# Patient Record
Sex: Male | Born: 1945 | Race: White | Hispanic: No | Marital: Married | Smoking: Never smoker
Health system: Southern US, Community
[De-identification: ages and names within clinical notes are randomized; demographics above are authoritative.]

## PROBLEM LIST (undated history)

## (undated) DIAGNOSIS — M1990 Unspecified osteoarthritis, unspecified site: Secondary | ICD-10-CM

## (undated) DIAGNOSIS — I10 Essential (primary) hypertension: Secondary | ICD-10-CM

## (undated) DIAGNOSIS — R319 Hematuria, unspecified: Secondary | ICD-10-CM

## (undated) DIAGNOSIS — R350 Frequency of micturition: Secondary | ICD-10-CM

## (undated) DIAGNOSIS — I499 Cardiac arrhythmia, unspecified: Secondary | ICD-10-CM

## (undated) DIAGNOSIS — Z973 Presence of spectacles and contact lenses: Secondary | ICD-10-CM

## (undated) DIAGNOSIS — N200 Calculus of kidney: Secondary | ICD-10-CM

## (undated) DIAGNOSIS — Z87442 Personal history of urinary calculi: Secondary | ICD-10-CM

## (undated) DIAGNOSIS — N201 Calculus of ureter: Secondary | ICD-10-CM

## (undated) DIAGNOSIS — I4891 Unspecified atrial fibrillation: Secondary | ICD-10-CM

## (undated) DIAGNOSIS — R3915 Urgency of urination: Secondary | ICD-10-CM

## (undated) DIAGNOSIS — Z8739 Personal history of other diseases of the musculoskeletal system and connective tissue: Secondary | ICD-10-CM

## (undated) DIAGNOSIS — C61 Malignant neoplasm of prostate: Secondary | ICD-10-CM

## (undated) HISTORY — PX: CHOLECYSTECTOMY: SHX55

## (undated) HISTORY — PX: CYST EXCISION: SHX5701

## (undated) HISTORY — PX: KNEE ARTHROSCOPY W/ MENISCECTOMY: SHX1879

## (undated) HISTORY — PX: ACHILLES TENDON SURGERY: SHX542

---

## 1979-09-12 DIAGNOSIS — J189 Pneumonia, unspecified organism: Secondary | ICD-10-CM

## 1979-09-12 HISTORY — DX: Pneumonia, unspecified organism: J18.9

## 2001-09-11 HISTORY — PX: LAPAROSCOPIC CHOLECYSTECTOMY: SUR755

## 2006-03-19 IMAGING — CT CT UROGRAM
2 series · 16 of 42 positions shown, 19 images · non-contrast
Comparison: NONE

CLINICAL DATA: Left back, side pain. 

CT UROGRAM
TECHNIQUE: Thin section unenhanced axial images were 
obtained to provide a CT urogram to evaluate for possible urinary 
tract stone.  Scan thicknesses were 3 mm with 3 mm increments.

[Series 2: wo · axial · 0.83mm/px · z∈[+1153,+1522]mm · 13 of 137 slices shown, 16 images]
[im 9/137  soft-tissue]
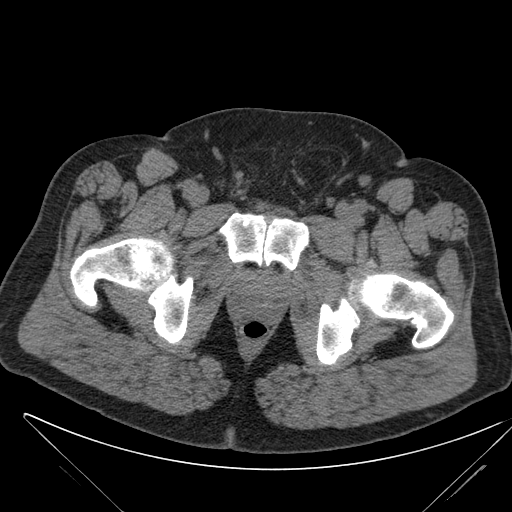
[im 9/137  bone]
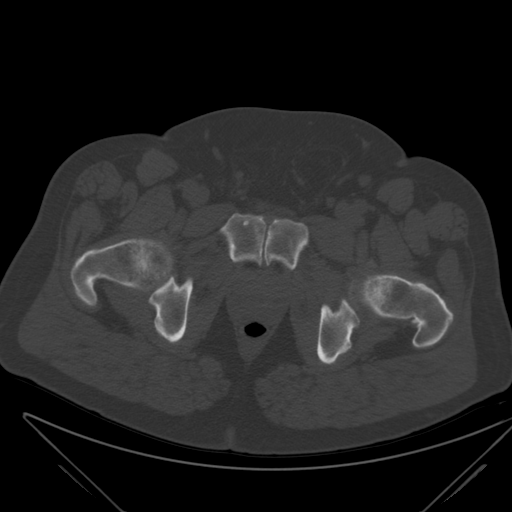
[im 22/137  soft-tissue]
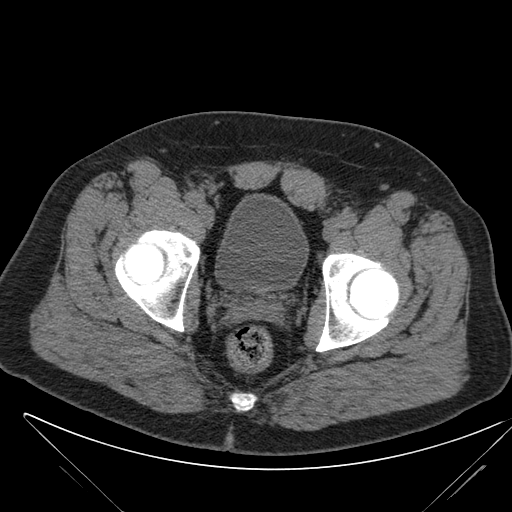
[im 36/137  soft-tissue]
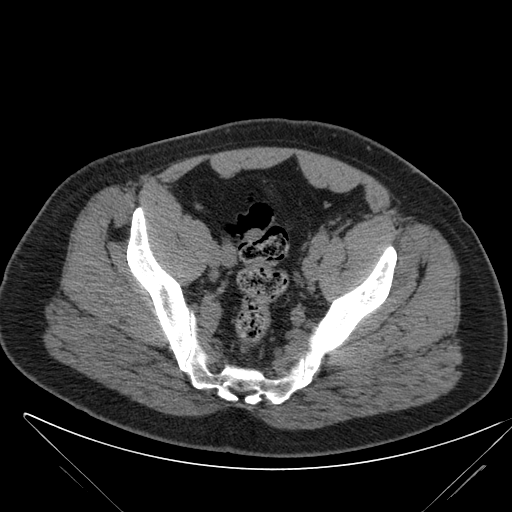
[im 49/137  soft-tissue]
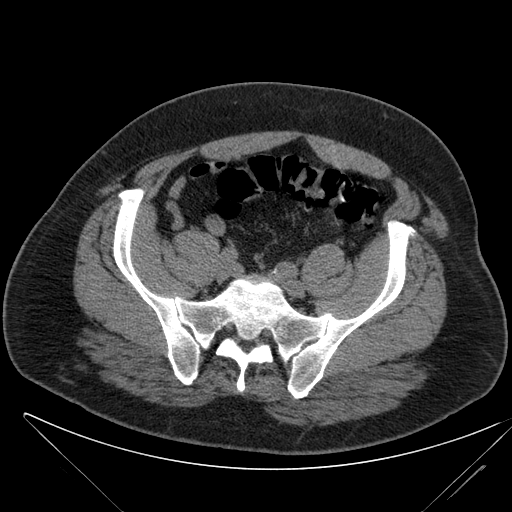
[im 62/137  soft-tissue]
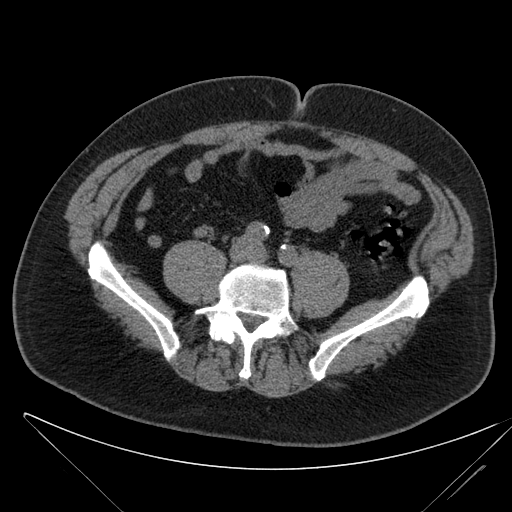
[im 75/137  soft-tissue]
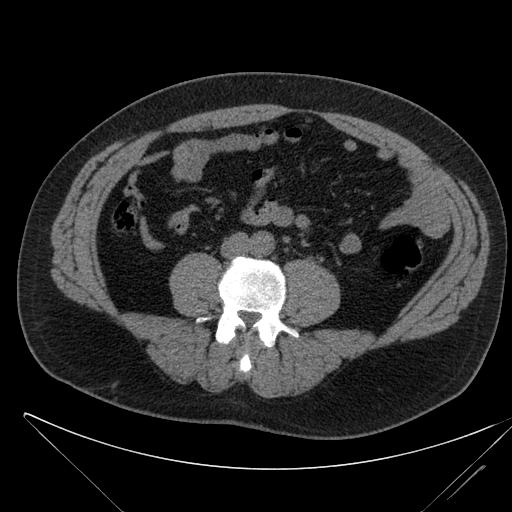
[im 88/137  soft-tissue]
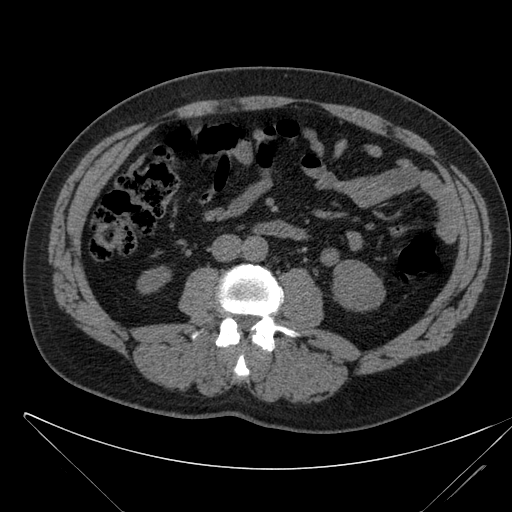
[im 101/137  soft-tissue]
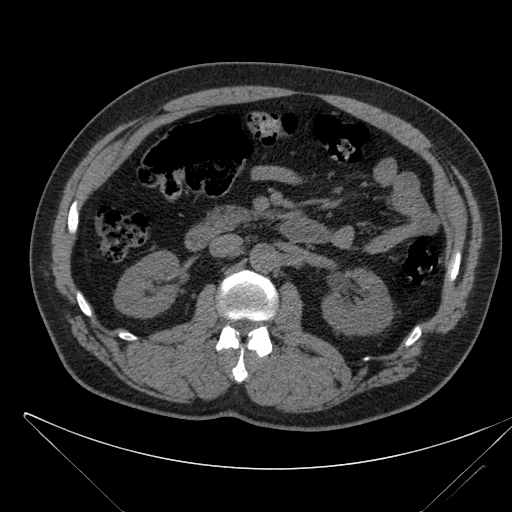
[im 115/137  soft-tissue]
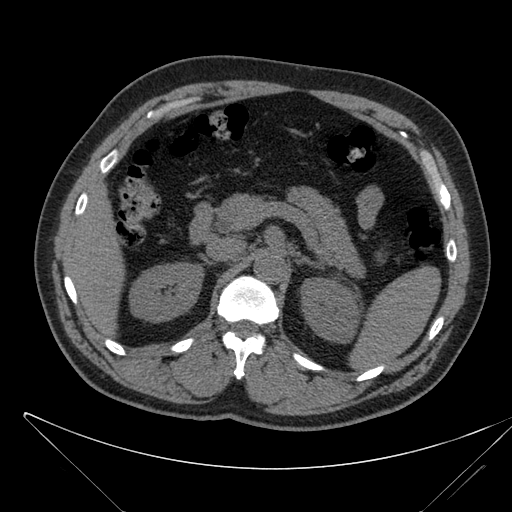
[im 115/137  bone]
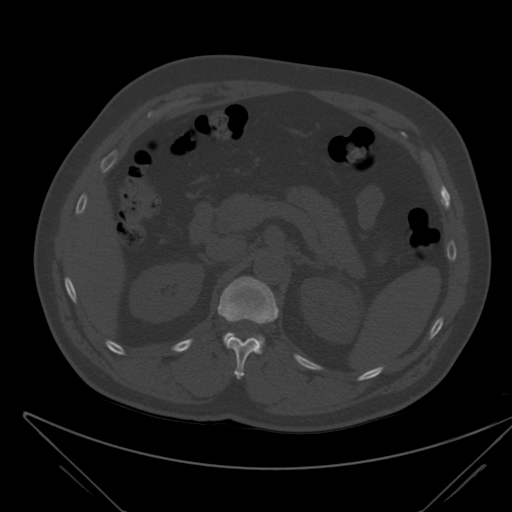
[im 119/137  lung]
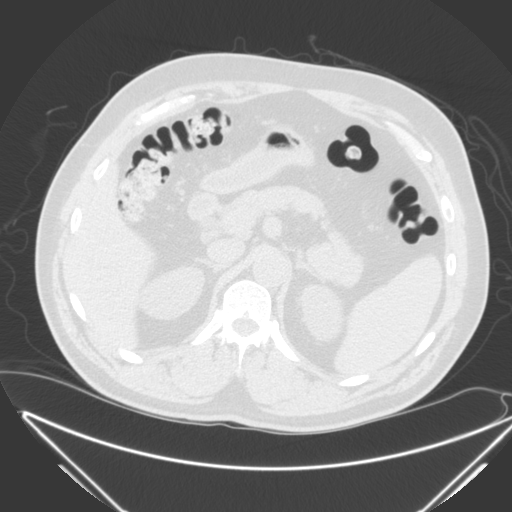
[im 123/137  lung]
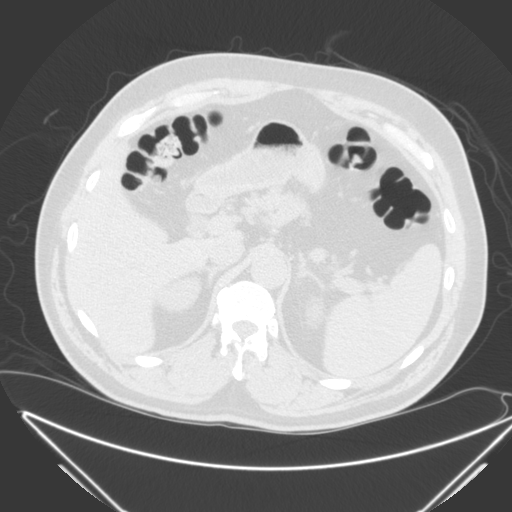
[im 128/137  soft-tissue]
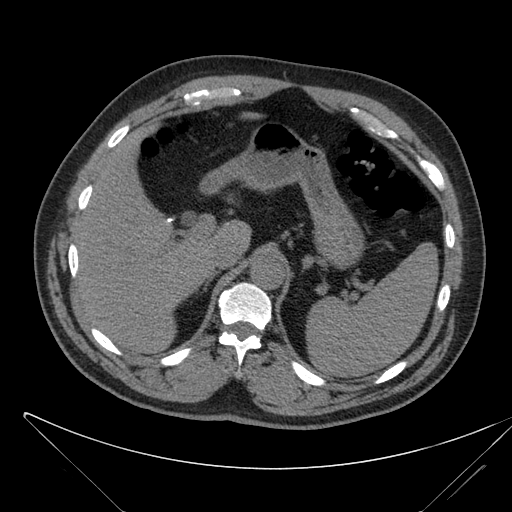
[im 128/137  lung]
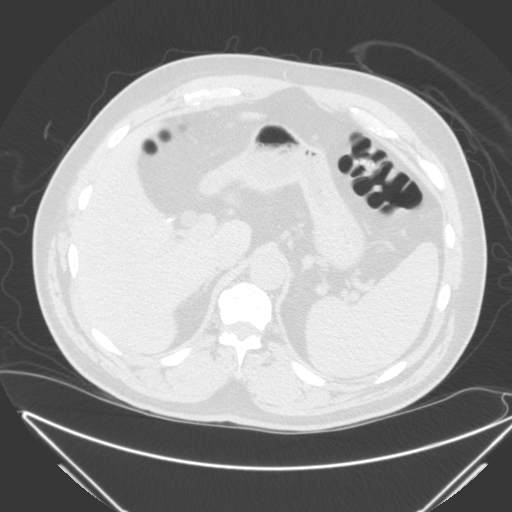
[im 132/137  lung]
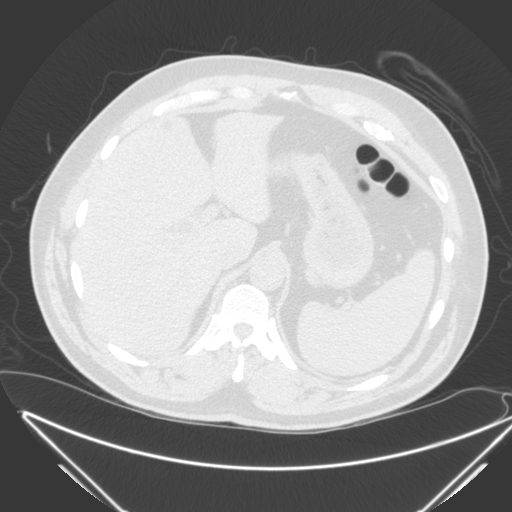

[coronals · coronal · 0.83mm/px · 3 of 81 slices shown]
[im 27/81  soft-tissue]
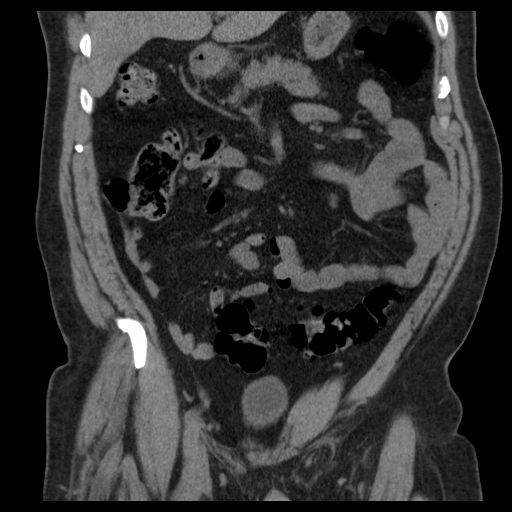
[im 36/81  soft-tissue]
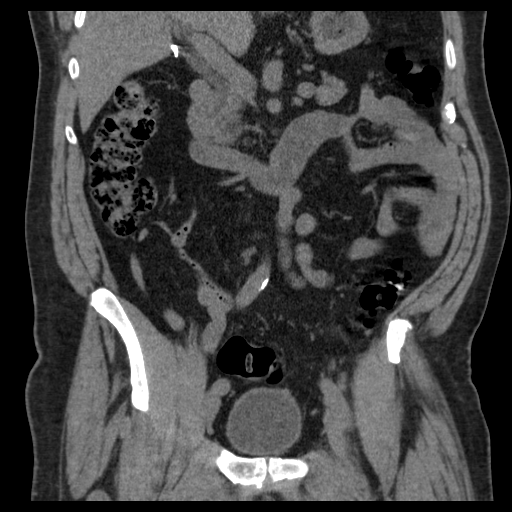
[im 45/81  soft-tissue]
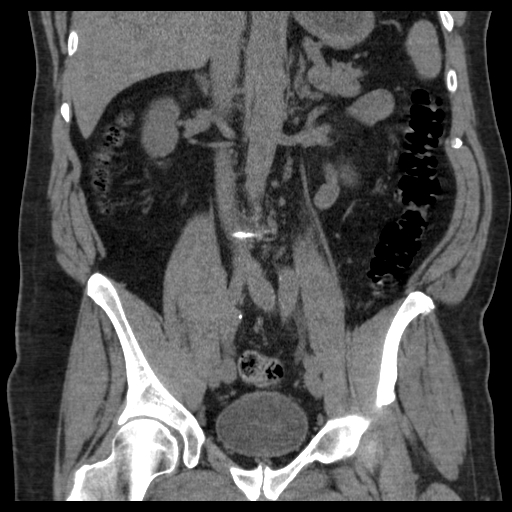

[16 of 42 positions shown; findings below may reference images not displayed]

FINDINGS: The study demonstrates that there is a small 2 to 3 mm 
partially obstructing stone seen at the left UV junction.  There 
is mild ureterectasis on the left as a result.  No 
pelvocaliectasis is seen on the left. No obstructive uropathy is 
seen on the right. There are two nonobstructing intracaliceal 
stones on the right, which measure 2 to 3 mm in size in the upper 
pole and lower pole distribution.  There is one nonobstructing 
intracaliceal stone on the left in the upper pole calix 
distribution. The liver, spleen, adrenal glands, and pancreas are 
normal in appearance.  Cholecystectomy is evident.  Aorta is 
nondilated.  No focal bowel wall thickening is seen. Bladder 
demonstrates mild wall thickening.  The rectum is normal in 
appearance.  Prostate is mildly enlarged.
IMPRESSION: Partially obstructing 2 to 3 mm stone seen at the 
left UV junction with mild left ureterectasis. Three 
nonobstructing intracaliceal stones bilaterally are present as 
described above. Cholecystectomy. Hasanaj Mekolli, M.D.

## 2012-04-08 ENCOUNTER — Ambulatory Visit
Admission: RE | Admit: 2012-04-08 | Discharge: 2012-04-08 | Disposition: A | Discharge status: Home / Self Care | Payer: BC Managed Care – PPO | Source: Ambulatory Visit | Attending: Chiropractic Medicine | Admitting: Chiropractic Medicine

## 2012-04-08 ENCOUNTER — Other Ambulatory Visit: Payer: Self-pay | Admitting: Chiropractic Medicine

## 2012-04-08 DIAGNOSIS — M542 Cervicalgia: Secondary | ICD-10-CM

## 2012-09-11 HISTORY — PX: TOTAL KNEE ARTHROPLASTY: SHX125

## 2012-09-20 ENCOUNTER — Other Ambulatory Visit: Payer: Self-pay | Admitting: Pain Medicine

## 2012-09-27 ENCOUNTER — Encounter (HOSPITAL_COMMUNITY): Payer: Self-pay | Admitting: Pharmacy Technician

## 2012-09-30 NOTE — Patient Instructions (Addendum)
KUPER RENNELS  09/30/2012                           YOUR PROCEDURE IS SCHEDULED ON:  10/07/12               PLEASE REPORT TO SHORT STAY CENTER AT :  12:00 PM               CALL THIS NUMBER IF ANY PROBLEMS THE DAY OF SURGERY :               [phone]                      REMEMBER:   Do not eat food or drink liquids AFTER MIDNIGHT  May have clear liquids UNTIL 6 HOURS BEFORE SURGERY (8:00 AM)  Clear liquids include soda, tea, black coffee, apple or grape juice, broth.  Take these medicines the morning of surgery with A SIP OF WATER:  Tramadol if needed   Do not wear jewelry, make-up   Do not wear lotions, powders, or perfumes.   Do not shave legs or underarms 12 hrs. before surgery (men may shave face)  Do not bring valuables to the hospital.  Contacts, dentures or bridgework may not be worn into surgery.  Leave suitcase in the car. After surgery it may be brought to your room.  For patients admitted to the hospital more than one night, checkout time is 11:00                          The day of discharge.   Patients discharged the day of surgery will not be allowed to drive home                             If going home same day of surgery, must have someone stay with you first                           24 hrs at home and arrange for some one to drive you home from hospital.    Special Instructions:   Please read over the following fact sheets that you were given:               1. MRSA  INFORMATION                      2. [place] PREPARING FOR SURGERY SHEET               3. INCENTIVE SPIROMETER                                                X_____________________________________________________________________        Failure to follow these instructions may result in cancellation of your surgery

## 2012-10-01 ENCOUNTER — Encounter (HOSPITAL_COMMUNITY): Payer: Self-pay

## 2012-10-01 ENCOUNTER — Encounter (HOSPITAL_COMMUNITY)
Admission: RE | Admit: 2012-10-01 | Discharge: 2012-10-01 | Disposition: A | Discharge status: Home / Self Care | Payer: BC Managed Care – PPO | Source: Ambulatory Visit | Attending: Specialist | Admitting: Specialist

## 2012-10-01 HISTORY — DX: Personal history of other diseases of the musculoskeletal system and connective tissue: Z87.39

## 2012-10-01 HISTORY — DX: Unspecified osteoarthritis, unspecified site: M19.90

## 2012-10-01 LAB — CBC WITH DIFFERENTIAL/PLATELET
Basophils Absolute: 0 10*3/uL (ref 0.0–0.1)
Basophils Relative: 0 % (ref 0–1)
Eosinophils Absolute: 0.2 10*3/uL (ref 0.0–0.7)
Eosinophils Relative: 2 % (ref 0–5)
HCT: 46 % (ref 39.0–52.0)
Hemoglobin: 16.2 g/dL (ref 13.0–17.0)
Lymphocytes Relative: 29 % (ref 12–46)
Lymphs Abs: 1.9 10*3/uL (ref 0.7–4.0)
MCH: 31.5 pg (ref 26.0–34.0)
MCHC: 35.2 g/dL (ref 30.0–36.0)
MCV: 89.3 fL (ref 78.0–100.0)
Monocytes Absolute: 0.6 10*3/uL (ref 0.1–1.0)
Monocytes Relative: 8 % (ref 3–12)
Neutro Abs: 4 10*3/uL (ref 1.7–7.7)
Neutrophils Relative %: 60 % (ref 43–77)
Platelets: 180 10*3/uL (ref 150–400)
RBC: 5.15 MIL/uL (ref 4.22–5.81)
RDW: 12.8 % (ref 11.5–15.5)
WBC: 6.6 10*3/uL (ref 4.0–10.5)

## 2012-10-01 LAB — APTT: aPTT: 30 seconds (ref 24–37)

## 2012-10-01 LAB — URINALYSIS, ROUTINE W REFLEX MICROSCOPIC
Bilirubin Urine: NEGATIVE
Glucose, UA: NEGATIVE mg/dL
Hgb urine dipstick: NEGATIVE
Ketones, ur: NEGATIVE mg/dL
Leukocytes, UA: NEGATIVE
Nitrite: NEGATIVE
Protein, ur: NEGATIVE mg/dL
Specific Gravity, Urine: 1.016 (ref 1.005–1.030)
Urobilinogen, UA: 0.2 mg/dL (ref 0.0–1.0)
pH: 5.5 (ref 5.0–8.0)

## 2012-10-01 LAB — COMPREHENSIVE METABOLIC PANEL
ALT: 28 U/L (ref 0–53)
AST: 21 U/L (ref 0–37)
Albumin: 4.3 g/dL (ref 3.5–5.2)
Alkaline Phosphatase: 55 U/L (ref 39–117)
BUN: 16 mg/dL (ref 6–23)
CO2: 27 mEq/L (ref 19–32)
Calcium: 9.9 mg/dL (ref 8.4–10.5)
Chloride: 103 mEq/L (ref 96–112)
Creatinine, Ser: 1.17 mg/dL (ref 0.50–1.35)
GFR calc Af Amer: 74 mL/min — ABNORMAL LOW (ref >90)
GFR calc non Af Amer: 64 mL/min — ABNORMAL LOW (ref >90)
Glucose, Bld: 103 mg/dL — ABNORMAL HIGH (ref 70–99)
Potassium: 4.1 mEq/L (ref 3.5–5.1)
Sodium: 139 mEq/L (ref 135–145)
Total Bilirubin: 0.6 mg/dL (ref 0.3–1.2)
Total Protein: 7.5 g/dL (ref 6.0–8.3)

## 2012-10-01 LAB — PROTIME-INR
INR: 1 (ref 0.00–1.49)
Prothrombin Time: 13.1 seconds (ref 11.6–15.2)

## 2012-10-01 LAB — SURGICAL PCR SCREEN
MRSA, PCR: NEGATIVE
Staphylococcus aureus: NEGATIVE

## 2012-10-02 ENCOUNTER — Other Ambulatory Visit: Payer: Self-pay | Admitting: Pain Medicine

## 2012-10-02 NOTE — H&P (Signed)
TOTAL KNEE ADMISSION H&P  Patient is being admitted for right total knee arthroplasty.  Subjective:  Chief Complaint:right knee pain.  HPI: [name] [name], 67 y.o. male, has a history of pain and functional disability in the right knee due to arthritis and has failed non-surgical conservative treatments for greater than 12 weeks to includeNSAID's and/or analgesics, corticosteriod injections, viscosupplementation injections, supervised PT with diminished ADL's post treatment and activity modification.  Onset of symptoms was abrupt, starting 1 years ago with rapidlly worsening course since that time. The patient noted prior procedures on the knee to include  arthroscopy on the right knee(s).  Patient currently rates pain in the right knee(s) at 7 out of 10 with activity. Patient has worsening of pain with activity and weight bearing.  Patient has evidence of subchondral cysts, subchondral sclerosis, periarticular osteophytes, joint subluxation and joint space narrowing by imaging studies. This patient has had knee scope,visco inj, cort injections. There is no active infection.  There are no active problems to display for this patient.  Past Medical History  Diagnosis Date  . Arthritis   . History of gout     Past Surgical History  Procedure Date  . Achiles   . Cholecystectomy   . Cyst excision     finger     (Not in a hospital admission) No Known Allergies  History  Substance Use Topics  . Smoking status: Never Smoker   . Smokeless tobacco: Not on file  . Alcohol Use: Yes     Comment: social    No family history on file.   Review of Systems  All other systems reviewed and are negative.    Objective:  Physical Exam cons alert appropriate well develop health male, HENT wnl,lungs clear,heart reg,abd soft nt +bs, up extrem wnl good strength, lower legr right 5-115 ROM with crepitus and pain, calfs soft NT, right full ROM painfree, legs NMVI, brreast, rectal defered Vital signs  in last 24 hours: @VSRANGES @  Labs:   Estimated Body mass index is 33.60 kg/(m^2) as calculated from the following:   Height as of 10/01/12: 6\' 4" (1.93 m).   Weight as of 10/01/12: 276 lb(125.193 kg).   Imaging Review Plain radiographs demonstrate severe degenerative joint disease of the right knee(s). The overall alignment ismild varus. The bone quality appears to be excellent for age and reported activity level.  Assessment/Plan:  End stage arthritis, right knee   The patient history, physical examination, clinical judgment of the provider and imaging studies are consistent with end stage degenerative joint disease of the right knee(s) and total knee arthroplasty is deemed medically necessary. The treatment options including medical management, injection therapy arthroscopy and arthroplasty were discussed at length. The risks and benefits of total knee arthroplasty were presented and reviewed. The risks due to aseptic loosening, infection, stiffness, patella tracking problems, thromboembolic complications and other imponderables were discussed. The patient acknowledged the explanation, agreed to proceed with the plan and consent was signed. Patient is being admitted for inpatient treatment for surgery, pain control, PT, OT, prophylactic antibiotics, VTE prophylaxis, progressive ambulation and ADL's and discharge planning. The patient is planning to be discharged home with home health services

## 2012-10-07 NOTE — Progress Notes (Signed)
Patient notified to be in short stay in the AM at 0800-  NPO after midnight except for meds as directed

## 2012-10-08 ENCOUNTER — Encounter (HOSPITAL_COMMUNITY): Payer: Self-pay | Admitting: *Deleted

## 2012-10-08 ENCOUNTER — Encounter (HOSPITAL_COMMUNITY): Payer: Self-pay | Admitting: Anesthesiology

## 2012-10-08 ENCOUNTER — Encounter (HOSPITAL_COMMUNITY)
Admission: RE | Disposition: A | Discharge status: Home Health | Payer: Self-pay | Source: Ambulatory Visit | Attending: Specialist

## 2012-10-08 ENCOUNTER — Inpatient Hospital Stay (HOSPITAL_COMMUNITY)
Admission: RE | Admit: 2012-10-08 | Discharge: 2012-10-11 | DRG: 209 | Disposition: A | Discharge status: Home Health | Payer: BC Managed Care – PPO | Source: Ambulatory Visit | Attending: Specialist | Admitting: Specialist

## 2012-10-08 ENCOUNTER — Inpatient Hospital Stay (HOSPITAL_COMMUNITY): Payer: BC Managed Care – PPO | Admitting: Anesthesiology

## 2012-10-08 DIAGNOSIS — D62 Acute posthemorrhagic anemia: Secondary | ICD-10-CM | POA: Diagnosis not present

## 2012-10-08 DIAGNOSIS — Z01812 Encounter for preprocedural laboratory examination: Secondary | ICD-10-CM

## 2012-10-08 DIAGNOSIS — E871 Hypo-osmolality and hyponatremia: Secondary | ICD-10-CM | POA: Diagnosis not present

## 2012-10-08 DIAGNOSIS — Z96659 Presence of unspecified artificial knee joint: Secondary | ICD-10-CM

## 2012-10-08 DIAGNOSIS — M1710 Unilateral primary osteoarthritis, unspecified knee: Principal | ICD-10-CM | POA: Diagnosis present

## 2012-10-08 HISTORY — PX: TOTAL KNEE ARTHROPLASTY: SHX125

## 2012-10-08 LAB — TYPE AND SCREEN
ABO/RH(D): A POS
Antibody Screen: NEGATIVE

## 2012-10-08 LAB — ABO/RH: ABO/RH(D): A POS

## 2012-10-08 SURGERY — ARTHROPLASTY, KNEE, TOTAL
Anesthesia: Spinal | Site: Knee | Laterality: Right | Wound class: Clean

## 2012-10-08 MED ORDER — HYDROMORPHONE HCL PF 1 MG/ML IJ SOLN
0.5000 mg | INTRAMUSCULAR | Status: DC | PRN
  Administered 2012-10-08 – 2012-10-09 (×5): 1 mg via INTRAVENOUS
  Filled 2012-10-08 (×5): qty 1

## 2012-10-08 MED ORDER — DOCUSATE SODIUM 100 MG PO CAPS
100.0000 mg | ORAL_CAPSULE | Freq: Two times a day (BID) | ORAL | Status: DC
  Administered 2012-10-08 – 2012-10-11 (×6): 100 mg via ORAL

## 2012-10-08 MED ORDER — METOCLOPRAMIDE HCL 10 MG PO TABS: 5.0000 mg | ORAL_TABLET | Freq: Three times a day (TID) | ORAL | Status: DC | PRN

## 2012-10-08 MED ORDER — FERROUS SULFATE 325 (65 FE) MG PO TABS
325.0000 mg | ORAL_TABLET | Freq: Three times a day (TID) | ORAL | Status: DC
  Administered 2012-10-08 – 2012-10-11 (×8): 325 mg via ORAL
  Filled 2012-10-08 (×11): qty 1

## 2012-10-08 MED ORDER — HYDROMORPHONE HCL PF 1 MG/ML IJ SOLN
0.2500 mg | INTRAMUSCULAR | Status: DC | PRN
  Administered 2012-10-08 (×4): 0.5 mg via INTRAVENOUS

## 2012-10-08 MED ORDER — HYDROMORPHONE HCL PF 1 MG/ML IJ SOLN
0.5000 mg | INTRAMUSCULAR | Status: DC | PRN
  Administered 2012-10-08 (×2): 0.5 mg via INTRAVENOUS

## 2012-10-08 MED ORDER — OXYCODONE HCL 5 MG/5ML PO SOLN
5.0000 mg | Freq: Once | ORAL | Status: DC | PRN
  Filled 2012-10-08: qty 5

## 2012-10-08 MED ORDER — ACETAMINOPHEN 650 MG RE SUPP: 650.0000 mg | Freq: Four times a day (QID) | RECTAL | Status: DC | PRN

## 2012-10-08 MED ORDER — SODIUM CHLORIDE 0.9 % IV SOLN
INTRAVENOUS | Status: DC | PRN
  Administered 2012-10-08: 40 mL

## 2012-10-08 MED ORDER — BUPIVACAINE IN DEXTROSE 0.75-8.25 % IT SOLN
INTRATHECAL | Status: DC | PRN
  Administered 2012-10-08: 2 mL via INTRATHECAL

## 2012-10-08 MED ORDER — PROPOFOL 10 MG/ML IV BOLUS
INTRAVENOUS | Status: DC | PRN
  Administered 2012-10-08: 50 mg via INTRAVENOUS
  Administered 2012-10-08: 20 mg via INTRAVENOUS

## 2012-10-08 MED ORDER — ZOLPIDEM TARTRATE 5 MG PO TABS: 5.0000 mg | ORAL_TABLET | Freq: Every evening | ORAL | Status: DC | PRN

## 2012-10-08 MED ORDER — OXYCODONE HCL 5 MG PO TABS
5.0000 mg | ORAL_TABLET | ORAL | Status: DC | PRN
  Administered 2012-10-08: 10 mg via ORAL
  Administered 2012-10-08: 5 mg via ORAL
  Administered 2012-10-08: 10 mg via ORAL
  Administered 2012-10-08: 5 mg via ORAL
  Administered 2012-10-09 – 2012-10-10 (×8): 10 mg via ORAL
  Filled 2012-10-08: qty 1
  Filled 2012-10-08: qty 2
  Filled 2012-10-08: qty 1
  Filled 2012-10-08 (×9): qty 2

## 2012-10-08 MED ORDER — PHENOL 1.4 % MT LIQD
1.0000 | OROMUCOSAL | Status: DC | PRN
  Filled 2012-10-08: qty 177

## 2012-10-08 MED ORDER — METHOCARBAMOL 100 MG/ML IJ SOLN
500.0000 mg | Freq: Four times a day (QID) | INTRAVENOUS | Status: DC | PRN
  Administered 2012-10-08: 500 mg via INTRAVENOUS
  Filled 2012-10-08: qty 5

## 2012-10-08 MED ORDER — POVIDONE-IODINE 7.5 % EX SOLN
Freq: Once | CUTANEOUS | Status: DC
Start: 2012-10-08 — End: 2012-10-08

## 2012-10-08 MED ORDER — POLYETHYLENE GLYCOL 3350 17 G PO PACK
17.0000 g | PACK | Freq: Every day | ORAL | Status: DC | PRN
  Administered 2012-10-10 – 2012-10-11 (×2): 17 g via ORAL

## 2012-10-08 MED ORDER — HYDROMORPHONE HCL PF 1 MG/ML IJ SOLN
INTRAMUSCULAR | Status: DC | PRN
  Administered 2012-10-08: 1 mg via INTRAVENOUS

## 2012-10-08 MED ORDER — MIDAZOLAM HCL 5 MG/5ML IJ SOLN
INTRAMUSCULAR | Status: DC | PRN
  Administered 2012-10-08: 2 mg via INTRAVENOUS

## 2012-10-08 MED ORDER — FLEET ENEMA 7-19 GM/118ML RE ENEM: 1.0000 | ENEMA | Freq: Once | RECTAL | Status: AC | PRN

## 2012-10-08 MED ORDER — ALUM & MAG HYDROXIDE-SIMETH 200-200-20 MG/5ML PO SUSP
30.0000 mL | ORAL | Status: DC | PRN
  Administered 2012-10-09 (×2): 30 mL via ORAL
  Filled 2012-10-08 (×2): qty 30

## 2012-10-08 MED ORDER — BISACODYL 10 MG RE SUPP: 10.0000 mg | Freq: Every day | RECTAL | Status: DC | PRN

## 2012-10-08 MED ORDER — ACETAMINOPHEN 10 MG/ML IV SOLN: 1000.0000 mg | Freq: Once | INTRAVENOUS | Status: DC | PRN

## 2012-10-08 MED ORDER — DEXTROSE 5 % IV SOLN
3.0000 g | INTRAVENOUS | Status: AC
  Administered 2012-10-08: 3 g via INTRAVENOUS
  Filled 2012-10-08: qty 3000

## 2012-10-08 MED ORDER — ENOXAPARIN SODIUM 30 MG/0.3ML ~~LOC~~ SOLN
30.0000 mg | Freq: Two times a day (BID) | SUBCUTANEOUS | Status: DC
  Administered 2012-10-09 – 2012-10-11 (×5): 30 mg via SUBCUTANEOUS
  Filled 2012-10-08 (×7): qty 0.3

## 2012-10-08 MED ORDER — SODIUM CHLORIDE 0.9 % IV SOLN: INTRAVENOUS | Status: DC

## 2012-10-08 MED ORDER — METOCLOPRAMIDE HCL 5 MG/ML IJ SOLN: 5.0000 mg | Freq: Three times a day (TID) | INTRAMUSCULAR | Status: DC | PRN

## 2012-10-08 MED ORDER — KETAMINE HCL 10 MG/ML IJ SOLN
INTRAMUSCULAR | Status: DC | PRN
  Administered 2012-10-08 (×2): 10 mg via INTRAVENOUS

## 2012-10-08 MED ORDER — ACETAMINOPHEN 10 MG/ML IV SOLN
1000.0000 mg | Freq: Four times a day (QID) | INTRAVENOUS | Status: AC
  Administered 2012-10-08 – 2012-10-09 (×3): 1000 mg via INTRAVENOUS
  Filled 2012-10-08 (×6): qty 100

## 2012-10-08 MED ORDER — PROMETHAZINE HCL 25 MG/ML IJ SOLN: 6.2500 mg | INTRAMUSCULAR | Status: DC | PRN

## 2012-10-08 MED ORDER — METHOCARBAMOL 500 MG PO TABS
500.0000 mg | ORAL_TABLET | Freq: Four times a day (QID) | ORAL | Status: DC | PRN
  Administered 2012-10-08 – 2012-10-11 (×7): 500 mg via ORAL
  Filled 2012-10-08 (×7): qty 1

## 2012-10-08 MED ORDER — PROPOFOL INFUSION 10 MG/ML OPTIME
INTRAVENOUS | Status: DC | PRN
  Administered 2012-10-08: 160 ug/kg/min via INTRAVENOUS

## 2012-10-08 MED ORDER — SODIUM CHLORIDE 0.9 % IR SOLN
Status: DC | PRN
  Administered 2012-10-08: 3000 mL
  Administered 2012-10-08: 1000 mL

## 2012-10-08 MED ORDER — HYDROMORPHONE HCL PF 1 MG/ML IJ SOLN
INTRAMUSCULAR | Status: AC
  Filled 2012-10-08: qty 1

## 2012-10-08 MED ORDER — DIPHENHYDRAMINE HCL 12.5 MG/5ML PO ELIX: 12.5000 mg | ORAL_SOLUTION | ORAL | Status: DC | PRN

## 2012-10-08 MED ORDER — MENTHOL 3 MG MT LOZG
1.0000 | LOZENGE | OROMUCOSAL | Status: DC | PRN
  Filled 2012-10-08: qty 9

## 2012-10-08 MED ORDER — ONDANSETRON HCL 4 MG/2ML IJ SOLN: 4.0000 mg | Freq: Four times a day (QID) | INTRAMUSCULAR | Status: DC | PRN

## 2012-10-08 MED ORDER — BUPIVACAINE LIPOSOME 1.3 % IJ SUSP
20.0000 mL | Freq: Once | INTRAMUSCULAR | Status: AC
  Administered 2012-10-08: 20 mL
  Filled 2012-10-08: qty 20

## 2012-10-08 MED ORDER — ACETAMINOPHEN 10 MG/ML IV SOLN
INTRAVENOUS | Status: DC | PRN
  Administered 2012-10-08: 1000 mg via INTRAVENOUS

## 2012-10-08 MED ORDER — ATROPINE SULFATE 0.4 MG/ML IJ SOLN
0.4000 mg | Freq: Once | INTRAMUSCULAR | Status: AC
  Filled 2012-10-08: qty 1

## 2012-10-08 MED ORDER — OXYCODONE HCL 5 MG PO TABS: 5.0000 mg | ORAL_TABLET | Freq: Once | ORAL | Status: DC | PRN

## 2012-10-08 MED ORDER — ATROPINE SULFATE 1 MG/ML IJ SOLN
INTRAMUSCULAR | Status: AC
  Administered 2012-10-08: 0.4 mg
  Filled 2012-10-08: qty 1

## 2012-10-08 MED ORDER — MEPERIDINE HCL 50 MG/ML IJ SOLN: 6.2500 mg | INTRAMUSCULAR | Status: DC | PRN

## 2012-10-08 MED ORDER — ONDANSETRON HCL 4 MG PO TABS: 4.0000 mg | ORAL_TABLET | Freq: Four times a day (QID) | ORAL | Status: DC | PRN

## 2012-10-08 MED ORDER — CELECOXIB 200 MG PO CAPS
200.0000 mg | ORAL_CAPSULE | Freq: Two times a day (BID) | ORAL | Status: DC
  Administered 2012-10-08 – 2012-10-09 (×3): 200 mg via ORAL
  Filled 2012-10-08 (×5): qty 1

## 2012-10-08 MED ORDER — ACETAMINOPHEN 325 MG PO TABS: 650.0000 mg | ORAL_TABLET | Freq: Four times a day (QID) | ORAL | Status: DC | PRN

## 2012-10-08 MED ORDER — CEFAZOLIN SODIUM-DEXTROSE 2-3 GM-% IV SOLR
2.0000 g | Freq: Four times a day (QID) | INTRAVENOUS | Status: AC
  Administered 2012-10-08 (×2): 2 g via INTRAVENOUS
  Filled 2012-10-08 (×2): qty 50

## 2012-10-08 MED ORDER — LACTATED RINGERS IV SOLN
INTRAVENOUS | Status: DC
  Administered 2012-10-08: 1000 mL via INTRAVENOUS
  Administered 2012-10-08 (×2): via INTRAVENOUS

## 2012-10-08 MED ORDER — POTASSIUM CHLORIDE IN NACL 20-0.9 MEQ/L-% IV SOLN
INTRAVENOUS | Status: DC
  Administered 2012-10-08 – 2012-10-09 (×2): via INTRAVENOUS
  Filled 2012-10-08 (×3): qty 1000

## 2012-10-08 SURGICAL SUPPLY — 67 items
BAG ZIPLOCK 12X15 (MISCELLANEOUS) ×2 IMPLANT
BANDAGE ELASTIC 4 VELCRO ST LF (GAUZE/BANDAGES/DRESSINGS) ×2 IMPLANT
BANDAGE ELASTIC 6 VELCRO ST LF (GAUZE/BANDAGES/DRESSINGS) ×2 IMPLANT
BANDAGE ESMARK 6X9 LF (GAUZE/BANDAGES/DRESSINGS) ×1 IMPLANT
BANDAGE GAUZE ELAST BULKY 4 IN (GAUZE/BANDAGES/DRESSINGS) ×4 IMPLANT
BLADE SAG 18X100X1.27 (BLADE) ×2 IMPLANT
BLADE SAW SGTL 13.0X1.19X90.0M (BLADE) ×2 IMPLANT
BNDG ESMARK 6X9 LF (GAUZE/BANDAGES/DRESSINGS) ×2
CEMENT HV SMART SET (Cement) ×4 IMPLANT
CLOTH BEACON ORANGE TIMEOUT ST (SAFETY) ×2 IMPLANT
CUFF TOURN SGL QUICK 34 (TOURNIQUET CUFF) ×1
CUFF TRNQT CYL 34X4X40X1 (TOURNIQUET CUFF) ×1 IMPLANT
DRAPE EXTREMITY T 121X128X90 (DRAPE) ×2 IMPLANT
DRAPE LG THREE QUARTER DISP (DRAPES) ×2 IMPLANT
DRAPE POUCH INSTRU U-SHP 10X18 (DRAPES) ×2 IMPLANT
DRAPE U-SHAPE 47X51 STRL (DRAPES) ×2 IMPLANT
DRSG PAD ABDOMINAL 8X10 ST (GAUZE/BANDAGES/DRESSINGS) ×4 IMPLANT
DURAPREP 26ML APPLICATOR (WOUND CARE) ×4 IMPLANT
ELECT REM PT RETURN 9FT ADLT (ELECTROSURGICAL) ×2
ELECTRODE REM PT RTRN 9FT ADLT (ELECTROSURGICAL) ×1 IMPLANT
EVACUATOR 1/8 PVC DRAIN (DRAIN) ×2 IMPLANT
FACESHIELD LNG OPTICON STERILE (SAFETY) ×10 IMPLANT
GAUZE XEROFORM 2X2 STRL (GAUZE/BANDAGES/DRESSINGS) ×2 IMPLANT
GLOVE BIO SURGEON STRL SZ 6.5 (GLOVE) ×2 IMPLANT
GLOVE BIOGEL PI IND STRL 6.5 (GLOVE) ×1 IMPLANT
GLOVE BIOGEL PI IND STRL 8 (GLOVE) ×1 IMPLANT
GLOVE BIOGEL PI INDICATOR 6.5 (GLOVE) ×1
GLOVE BIOGEL PI INDICATOR 8 (GLOVE) ×1
GLOVE ECLIPSE 8.0 STRL XLNG CF (GLOVE) ×2 IMPLANT
GLOVE SURG ORTHO 8.0 STRL STRW (GLOVE) ×4 IMPLANT
GLOVE SURG ORTHO 9.0 STRL STRW (GLOVE) ×2 IMPLANT
GLOVE SURG SS PI 6.5 STRL IVOR (GLOVE) ×6 IMPLANT
GOWN PREVENTION PLUS XLARGE (GOWN DISPOSABLE) ×6 IMPLANT
GOWN STRL NON-REIN LRG LVL3 (GOWN DISPOSABLE) ×2 IMPLANT
GOWN STRL REIN XL XLG (GOWN DISPOSABLE) ×2 IMPLANT
HANDPIECE INTERPULSE COAX TIP (DISPOSABLE) ×1
IMMOBILIZER KNEE 20 (SOFTGOODS) ×4
IMMOBILIZER KNEE 20 THIGH 36 (SOFTGOODS) ×2 IMPLANT
KIT BASIN OR (CUSTOM PROCEDURE TRAY) ×2 IMPLANT
NDL SAFETY ECLIPSE 18X1.5 (NEEDLE) ×1 IMPLANT
NEEDLE HYPO 18GX1.5 SHARP (NEEDLE) ×1
NS IRRIG 1000ML POUR BTL (IV SOLUTION) ×2 IMPLANT
PACK TOTAL JOINT (CUSTOM PROCEDURE TRAY) ×2 IMPLANT
POSITIONER SURGICAL ARM (MISCELLANEOUS) ×2 IMPLANT
SET HNDPC FAN SPRY TIP SCT (DISPOSABLE) ×1 IMPLANT
SET PAD KNEE POSITIONER (MISCELLANEOUS) ×2 IMPLANT
SPONGE GAUZE 4X4 12PLY (GAUZE/BANDAGES/DRESSINGS) ×2 IMPLANT
SPONGE LAP 18X18 X RAY DECT (DISPOSABLE) IMPLANT
SPONGE SURGIFOAM ABS GEL 100 (HEMOSTASIS) ×2 IMPLANT
STOCKINETTE 6  STRL (DRAPES) ×1
STOCKINETTE 6 STRL (DRAPES) ×1 IMPLANT
STRIP CLOSURE SKIN 1/2X4 (GAUZE/BANDAGES/DRESSINGS) ×2 IMPLANT
SUCTION FRAZIER 12FR DISP (SUCTIONS) ×2 IMPLANT
SUT BONE WAX W31G (SUTURE) ×2 IMPLANT
SUT MNCRL AB 3-0 PS2 18 (SUTURE) ×2 IMPLANT
SUT VIC AB 1 CT1 27 (SUTURE) ×3
SUT VIC AB 1 CT1 27XBRD ANTBC (SUTURE) ×3 IMPLANT
SUT VIC AB 2-0 CT1 27 (SUTURE) ×2
SUT VIC AB 2-0 CT1 TAPERPNT 27 (SUTURE) ×2 IMPLANT
SUT VLOC 180 0 24IN GS25 (SUTURE) ×2 IMPLANT
SYR 50ML LL SCALE MARK (SYRINGE) ×2 IMPLANT
TAPE STRIPS DRAPE STRL (GAUZE/BANDAGES/DRESSINGS) ×2 IMPLANT
TOWEL OR 17X26 10 PK STRL BLUE (TOWEL DISPOSABLE) ×4 IMPLANT
TOWER CARTRIDGE SMART MIX (DISPOSABLE) ×2 IMPLANT
TRAY FOLEY CATH 14FRSI W/METER (CATHETERS) ×2 IMPLANT
WATER STERILE IRR 1500ML POUR (IV SOLUTION) ×4 IMPLANT
WRAP KNEE MAXI GEL POST OP (GAUZE/BANDAGES/DRESSINGS) ×2 IMPLANT

## 2012-10-08 NOTE — H&P (Signed)
TOTAL KNEE ADMISSION H&P  Patient is being admitted for right total knee arthroplasty.  Subjective:  Chief Complaint:right knee pain.  HPI: [name] [name], 67 y.o. male, has a history of pain and functional disability in the right knee due to arthritis and has failed non-surgical conservative treatments for greater than 12 weeks to includeNSAID's and/or analgesics, corticosteriod injections, viscosupplementation injections, supervised PT with diminished ADL's post treatment and activity modification.  Onset of symptoms was abrupt, starting 1 years ago with rapidlly worsening course since that time. The patient noted prior procedures on the knee to include  arthroscopy on the right knee(s).  Patient currently rates pain in the right knee(s) at 7 out of 10 with activity. Patient has worsening of pain with activity and weight bearing.  Patient has evidence of subchondral cysts, subchondral sclerosis, periarticular osteophytes, joint subluxation and joint space narrowing by imaging studies. This patient has had knee scope,visco inj, cort injections. There is no active infection.  There are no active problems to display for this patient.  Past Medical History  Diagnosis Date  . Arthritis   . History of gout     Past Surgical History  Procedure Date  . Achiles   . Cholecystectomy   . Cyst excision     finger    Prescriptions prior to admission  Medication Sig Dispense Refill  . acetaminophen (TYLENOL) 500 MG tablet Take 1,500 mg by mouth every 6 (six) hours as needed. PAIN      . meloxicam (MOBIC) 15 MG tablet Take 15 mg by mouth daily.      . traMADol (ULTRAM) 50 MG tablet Take 50 mg by mouth every 6 (six) hours as needed. Pain       No Known Allergies  History  Substance Use Topics  . Smoking status: Never Smoker   . Smokeless tobacco: Not on file  . Alcohol Use: Yes     Comment: social    History reviewed. No pertinent family history.   Review of Systems  All other systems  reviewed and are negative.    Objective:  Physical Exam cons alert appropriate well develop health male, HENT wnl,lungs clear,heart reg,abd soft nt +bs, up extrem wnl good strength, lower legr right 5-115 ROM with crepitus and pain, calfs soft NT, right full ROM painfree, legs NMVI, brreast, rectal defered Vital signs in last 24 hours: @VSRANGES @  Labs:   There is no height or weight on file to calculate BMI.   Imaging Review Plain radiographs demonstrate severe degenerative joint disease of the right knee(s). The overall alignment ismild varus. The bone quality appears to be excellent for age and reported activity level.  Assessment/Plan:  End stage arthritis, right knee   The patient history, physical examination, clinical judgment of the provider and imaging studies are consistent with end stage degenerative joint disease of the right knee(s) and total knee arthroplasty is deemed medically necessary. The treatment options including medical management, injection therapy arthroscopy and arthroplasty were discussed at length. The risks and benefits of total knee arthroplasty were presented and reviewed. The risks due to aseptic loosening, infection, stiffness, patella tracking problems, thromboembolic complications and other imponderables were discussed. The patient acknowledged the explanation, agreed to proceed with the plan and consent was signed. Patient is being admitted for inpatient treatment for surgery, pain control, PT, OT, prophylactic antibiotics, VTE prophylaxis, progressive ambulation and ADL's and discharge planning. The patient is planning to be discharged home with home health services

## 2012-10-08 NOTE — Progress Notes (Signed)
Heart rate now up to 70

## 2012-10-08 NOTE — Plan of Care (Signed)
Problem: Consults Goal: Diagnosis- Total Joint Replacement Primary Total Knee     

## 2012-10-08 NOTE — Anesthesia Procedure Notes (Signed)
Spinal  Patient location during procedure: OR Start time: 10/08/2012 11:15 AM End time: 10/08/2012 11:20 AM Staffing Anesthesiologist: Lewie Loron R Performed by: anesthesiologist  Preanesthetic Checklist Completed: patient identified, site marked, surgical consent, pre-op evaluation, timeout performed, IV checked, risks and benefits discussed and monitors and equipment checked Spinal Block Patient position: sitting Prep: ChloraPrep Patient monitoring: heart rate, continuous pulse ox and blood pressure Approach: midline Location: L3-4 Injection technique: single-shot Needle Needle type: Sprotte  Needle gauge: 24 G Needle length: 9 cm Assessment Sensory level: T8

## 2012-10-08 NOTE — Anesthesia Postprocedure Evaluation (Signed)
  Anesthesia Post-op Note  Patient: [name] [name]  Procedure(s) Performed: Procedure(s) (LRB): TOTAL KNEE ARTHROPLASTY (Right)  Patient Location: PACU  Anesthesia Type: Spinal  Level of Consciousness: awake and alert   Airway and Oxygen Therapy: Patient Spontanous Breathing  Post-op Pain: mild  Post-op Assessment: Post-op Vital signs reviewed, Patient's Cardiovascular Status Stable, Respiratory Function Stable, Patent Airway and No signs of Nausea or vomiting  Last Vitals:  Filed Vitals:   10/08/12 1530  BP: 148/74  Pulse: 59  Temp: 36.8 C  Resp: 16    Post-op Vital Signs: stable   Complications: No apparent anesthesia complications. Moving both legs.

## 2012-10-08 NOTE — Anesthesia Preprocedure Evaluation (Addendum)
Anesthesia Evaluation  Patient identified by MRN, date of birth, ID band Patient awake    Reviewed: Allergy & Precautions, H&P , NPO status , Patient's Chart, lab work & pertinent test results  Airway Mallampati: I TM Distance: >3 FB Neck ROM: Full    Dental  (+) Dental Advisory Given and Teeth Intact   Pulmonary neg pulmonary ROS,  breath sounds clear to auscultation  Pulmonary exam normal       Cardiovascular negative cardio ROS  Rhythm:Regular Rate:Normal     Neuro/Psych negative neurological ROS  negative psych ROS   GI/Hepatic negative GI ROS, Neg liver ROS,   Endo/Other  negative endocrine ROS  Renal/GU negative Renal ROS  negative genitourinary   Musculoskeletal  (+) Arthritis -, Osteoarthritis,    Abdominal   Peds negative pediatric ROS (+)  Hematology negative hematology ROS (+)   Anesthesia Other Findings   Reproductive/Obstetrics                         Anesthesia Physical Anesthesia Plan  ASA: II  Anesthesia Plan: Spinal   Post-op Pain Management:    Induction:   Airway Management Planned: Simple Face Mask  Additional Equipment:   Intra-op Plan:   Post-operative Plan:   Informed Consent: I have reviewed the patients History and Physical, chart, labs and discussed the procedure including the risks, benefits and alternatives for the proposed anesthesia with the patient or authorized representative who has indicated his/her understanding and acceptance.   Dental advisory given  Plan Discussed with: CRNA  Anesthesia Plan Comments:        Anesthesia Quick Evaluation

## 2012-10-08 NOTE — Op Note (Signed)
DATE OF SURGERY:  10/08/2012  TIME: 1:21 PM  PATIENT NAME:  [name] [name]    AGE: 67 y.o.   PRE-OPERATIVE DIAGNOSIS:  right knee oa   POST-OPERATIVE DIAGNOSIS:  right knee oa   PROCEDURE:  Procedure(s): TOTAL KNEE ARTHROPLASTY  SURGEON:  Marshae Azam ANDREW  ASSISTANT:  Oneida Alar, PA-C, present and scrubbed throughout the case, critical for assistance with exposure, retraction, instrumentation, and closure.  OPERATIVE IMPLANTS: Depuy PFC Sigma Rotating Platform.  Femur size 5, Tibia size 6, Patella size 41 3-peg oval button, with a 12.5 mm polyethylene insert.   PREOPERATIVE INDICATIONS:   [name] [name] is a 67 y.o. year old male with end stage bone on bone arthritis of the knee who failed conservative treatment and elected for Total Knee Arthroplasty.   The risks, benefits, and alternatives were discussed at length including but not limited to the risks of infection, bleeding, nerve injury, stiffness, blood clots, the need for revision surgery, cardiopulmonary complications, among others, and they were willing to proceed.  OPERATIVE DESCRIPTION:  The patient was brought to the operative room and placed in a supine position.  Spinal anesthesia was administered.  IV antibiotics were given.  The lower extremity was prepped and draped in the usual sterile fashion.  Time out was performed.  The leg was elevated and exsanguinated and the tourniquet was inflated.  Anterior quadriceps tendon splitting approach was performed.  The patella was retracted and osteophytes were removed.  The anterior horn of the medial and lateral meniscus was removed and cruciate ligaments resected.   The distal femur was opened with the drill and the intramedullary distal femoral cutting jig was utilized, set at 5 degrees resecting 10 mm off the distal femur.  Care was taken to protect the collateral ligaments.  The distal femoral sizing jig was applied, taking care to avoid notching.  Then the 4-in-1  cutting jig was applied and the anterior and posterior femur was cut, along with the chamfer cuts.    Then the extramedullary tibial cutting jig was utilized making the appropriate cut using the anterior tibial crest as a reference building in appropriate posterior slope.  Care was taken during the cut to protect the medial and collateral ligaments.  The proximal tibia was removed along with the posterior horns of the menisci.   The posterior medial femoral osteophytes and posterior lateral femoral osteophytes were removed.    The flexion gap was then measured and was symmetric with the extension gap, measured at 10.  I completed the distal femoral preparation using the appropriate jig to prepare the box.  The patella was then measured, and cut with the saw.    The proximal tibia sized and prepared accordingly with the reamer and the punch, and then all components were trialed with the trial insert.  The knee was found to have excellent balance and full motion.    The above named components were then cemented into place and all excess cement was removed.  The trial polyethylene component was in place during cementation, and then was exchanged for the real polyethylene component.    The knee was easily taken through a range of motion and the patella tracked well and the knee irrigated copiously and the parapatellar and subcutaneous tissue closed with vicryl, and monocryl with steri strips for the skin.  The arthrotomy was closed at 90 of flexion. The wounds were dressed with sterile gauze and the tourniquet released and the patient was awakened and returned to the  PACU in stable and satisfactory condition.  There were no complications.  Total tourniquet time was 90 minutes.Vlock capsule closure. perosteal injection 20cc Esperaldo and 40 cc NS.

## 2012-10-08 NOTE — Transfer of Care (Signed)
Immediate Anesthesia Transfer of Care Note  Patient: [name] [name]  Procedure(s) Performed: Procedure(s) (LRB) with comments: TOTAL KNEE ARTHROPLASTY (Right)  Patient Location: PACU  Anesthesia Type:MAC and Spinal  Level of Consciousness: awake, alert , oriented, patient cooperative and responds to stimulation  Airway & Oxygen Therapy: Patient Spontanous Breathing and Patient connected to face mask oxygen  Post-op Assessment: Report given to PACU RN and Post -op Vital signs reviewed and stable  Post vital signs: Reviewed and stable  Complications: No apparent anesthesia complications

## 2012-10-08 NOTE — Preoperative (Signed)
Beta Blockers   Reason not to administer Beta Blockers:Not Applicable, not on home BB 

## 2012-10-08 NOTE — Progress Notes (Signed)
Atropine 0.4 mg. IVP given as ordered by Dr. Council Mechanic for slow heart rate

## 2012-10-09 ENCOUNTER — Encounter (HOSPITAL_COMMUNITY): Payer: Self-pay | Admitting: Specialist

## 2012-10-09 LAB — BASIC METABOLIC PANEL
BUN: 13 mg/dL (ref 6–23)
CO2: 25 mEq/L (ref 19–32)
Calcium: 8.2 mg/dL — ABNORMAL LOW (ref 8.4–10.5)
Chloride: 99 mEq/L (ref 96–112)
Creatinine, Ser: 0.89 mg/dL (ref 0.50–1.35)
GFR calc Af Amer: >90 mL/min (ref >90)
GFR calc non Af Amer: 88 mL/min — ABNORMAL LOW (ref >90)
Glucose, Bld: 142 mg/dL — ABNORMAL HIGH (ref 70–99)
Potassium: 4 mEq/L (ref 3.5–5.1)
Sodium: 134 mEq/L — ABNORMAL LOW (ref 135–145)

## 2012-10-09 LAB — CBC
HCT: 37.6 % — ABNORMAL LOW (ref 39.0–52.0)
Hemoglobin: 13 g/dL (ref 13.0–17.0)
MCH: 30.8 pg (ref 26.0–34.0)
MCHC: 34.6 g/dL (ref 30.0–36.0)
MCV: 89.1 fL (ref 78.0–100.0)
Platelets: 175 10*3/uL (ref 150–400)
RBC: 4.22 MIL/uL (ref 4.22–5.81)
RDW: 13.2 % (ref 11.5–15.5)
WBC: 11.9 10*3/uL — ABNORMAL HIGH (ref 4.0–10.5)

## 2012-10-09 NOTE — Evaluation (Addendum)
Physical Therapy Evaluation Patient Details Name: [name] [name] MRN: [id number] DOB: [birth year] Today's Date: 10/09/2012 Time: 1191-4782 PT Time Calculation (min): 33 min  PT Assessment / Plan / Recommendation Clinical Impression  pt is s/p right TKA and will benefit form PT o maximize independence for home with wife    PT Assessment  Patient needs continued PT services    Follow Up Recommendations  Home health PT    Does the patient have the potential to tolerate intense rehabilitation      Barriers to Discharge        Equipment Recommendations  RW with 5 inch wheels   Recommendations for Other Services     Frequency 7X/week    Precautions / Restrictions Precautions Precautions: Knee Required Braces or Orthoses: Knee Immobilizer - Right Knee Immobilizer - Right: Discontinue once straight leg raise with < 10 degree lag Restrictions Weight Bearing Restrictions: No   Pertinent Vitals/Pain sats 92-100% on RA      Mobility  Bed Mobility Bed Mobility: Not assessed Transfers Transfers: Sit to Stand;Stand to Sit Sit to Stand: 4: Min guard Stand to Sit: 4: Min guard Details for Transfer Assistance: cues for hand placement Ambulation/Gait Ambulation/Gait Assistance: 4: Min guard Ambulation Distance (Feet): 60 Feet Assistive device: Rolling walker Ambulation/Gait Assistance Details: cues for sequence Gait Pattern: Step-to pattern Gait velocity: decreased    Shoulder Instructions     Exercises Total Joint Exercises Ankle Circles/Pumps: AROM;Both;10 reps Quad Sets: AROM;Both;10 reps   PT Diagnosis: Difficulty walking  PT Problem List: Decreased strength;Decreased range of motion;Decreased knowledge of use of DME;Decreased activity tolerance;Decreased balance;Decreased mobility PT Treatment Interventions: DME instruction;Gait training;Stair training;Functional mobility training;Therapeutic activities;Therapeutic exercise;Balance training;Patient/family education     PT Goals Acute Rehab PT Goals PT Goal Formulation: With patient Time For Goal Achievement: 10/16/12 Potential to Achieve Goals: Good Pt will go Supine/Side to Sit: with supervision PT Goal: Supine/Side to Sit - Progress: Goal set today Pt will go Sit to Stand: with supervision;Independently PT Goal: Sit to Stand - Progress: Goal set today Pt will go Stand to Sit: with supervision PT Goal: Stand to Sit - Progress: Goal set today Pt will Ambulate: 51 - 150 feet;with supervision PT Goal: Ambulate - Progress: Goal set today Pt will Go Up / Down Stairs: 3-5 stairs;with least restrictive assistive device;with min assist PT Goal: Up/Down Stairs - Progress: Goal set today  Visit Information  Last PT Received On: 10/09/12 Assistance Needed: +1    Subjective Data  Subjective: I called for pain medicine Patient Stated Goal: home, return to playing golf   Prior Functioning  Home Living Lives With: Spouse Available Help at Discharge: Family Type of Home: House Home Access: Stairs to enter Secretary/administrator of Steps: 6 Entrance Stairs-Rails: Right Home Layout: One level Bathroom Shower/Tub: Health visitor: Standard Home Adaptive Equipment: Crutches Prior Function Level of Independence: Independent Able to Take Stairs?: Yes Driving: Yes Vocation: Retired Musician: No difficulties Dominant Hand: Right    Cognition  Overall Cognitive Status: Appears within functional limits for tasks assessed/performed Arousal/Alertness: Awake/alert Orientation Level: Appears intact for tasks assessed Behavior During Session: [facility] for tasks performed    Extremity/Trunk Assessment Right Lower Extremity Assessment RLE ROM/Strength/Tone: Deficits;Unable to fully assess;Due to pain RLE ROM/Strength/Tone Deficits: ankle WFL; able to assist minimally with SLR Left Lower Extremity Assessment LLE ROM/Strength/Tone: [facility] for tasks assessed Trunk  Assessment Trunk Assessment: Normal   Balance    End of Session PT - End of Session Equipment  Utilized During Treatment: Right knee immobilizer Activity Tolerance: Patient tolerated treatment well Patient left: in chair;with call bell/phone within reach;with family/visitor present Nurse Communication: Patient requests pain meds;Mobility status CPM Right Knee CPM Right Knee: Off Right Knee Flexion (Degrees): 60  Right Knee Extension (Degrees): 0  Additional Comments: 6 to 8 hrs per day. increase by 10 degrees per day  GP     [facility] 10/09/2012, 11:09 AM

## 2012-10-09 NOTE — Progress Notes (Signed)
  CARE MANAGEMENT NOTE 10/09/2012  Patient:  [name] [name], [name] [name]   Account Number:  [id number]  Date Initiated:  10/09/2012  Documentation initiated by:  Colleen Can  Subjective/Objective Assessment:   dx right knee osteoarthritis; total knee replacemnt     Action/Plan:   Anticipated DC Date:     Anticipated DC Plan:  HOME W HOME HEALTH SERVICES       Status of service:  In process, will continue to follow Per UR Regulation:  Reviewed for med. necessity/level of care/duration of stay

## 2012-10-09 NOTE — Progress Notes (Signed)
10/09/12 1405  PT Visit Information  Last PT Received On 10/09/12  Assistance Needed +1  PT Time Calculation  PT Start Time 1326  PT Stop Time 1403  PT Time Calculation (min) 37 min  Subjective Data  Subjective pt getting pain mds  Precautions  Precautions Knee  Required Braces or Orthoses Knee Immobilizer - Right  Knee Immobilizer - Right Discontinue once straight leg raise with < 10 degree lag  Restrictions  Weight Bearing Restrictions No  RLE Weight Bearing WBAT  Bed Mobility  Bed Mobility Not assessed  Transfers  Transfers Sit to Stand;Stand to Sit  Sit to Stand 4: Min guard;5: Supervision;From chair/3-in-1  Stand to Sit 4: Min guard;5: Supervision;To chair/3-in-1  Details for Transfer Assistance cues for hand placement (pt wanted to stay in chair)  Ambulation/Gait  Ambulation/Gait Assistance 4: Min guard;5: Supervision  Ambulation Distance (Feet) 60 Feet  Assistive device Rolling walker  Ambulation/Gait Assistance Details cues for sequence, step length and RW position  Gait Pattern Step-to pattern  Total Joint Exercises  Ankle Circles/Pumps AROM;Both;10 reps  Quad Sets AROM;Both;10 reps  [employer];Right;10 reps  Heel Slides AAROM;Right;10 reps  Hip ABduction/ADduction AAROM;Right;10 reps  Straight Leg Raises AAROM;10 reps;Right  PT - End of Session  Equipment Utilized During Treatment Right knee immobilizer  Activity Tolerance Patient tolerated treatment well  Patient left in chair;with call bell/phone within reach;with family/visitor present  Nurse Communication Mobility status  PT - Assessment/Plan  Comments on Treatment Session pt progressign well, willneed to practice stairs in am  PT Plan Discharge plan remains appropriate;Frequency remains appropriate  PT Frequency 7X/week  Follow Up Recommendations Home health PT  PT equipment Rolling walker with 5" wheels  Acute Rehab PT Goals  Time For Goal Achievement 10/16/12  Potential to Achieve Goals  Good  Pt will go Supine/Side to Sit with supervision  Pt will go Sit to Stand with supervision;Independently  PT Goal: Sit to Stand - Progress Progressing toward goal  Pt will go Stand to Sit with supervision  PT Goal: Stand to Sit - Progress Progressing toward goal  Pt will Ambulate 51 - 150 feet;with supervision  PT Goal: Ambulate - Progress Progressing toward goal  PT General Charges  $$ ACUTE PT VISIT 1 Procedure  PT Treatments  $Gait Training 8-22 mins  $Therapeutic Exercise 8-22 mins

## 2012-10-09 NOTE — Progress Notes (Signed)
Subjective: Patient feels like he is doing fairly well today. Denies any shortness breath chest pain nausea. He feels like he had a fairly decent night last night but he did not sleep all night. His pain is fairly well controlled on the current medications   Objective: Vital signs in last 24 hours: Temp:  [97.5 F (36.4 C)-98.7 F (37.1 C)] 98.7 F (37.1 C) (01/29 0540) Pulse Rate:  [40-72] 71  (01/29 0540) Resp:  [10-20] 16  (01/29 0540) BP: (121-158)/(63-102) 123/87 mmHg (01/29 0540) SpO2:  [92 %-100 %] 96 % (01/29 0540) Weight:  [122.471 kg (270 lb)] 122.471 kg (270 lb) (01/29 0630)  Intake/Output from previous day: 01/28 0701 - 01/29 0700 In: 3377.5 [P.O.:360; I.V.:2962.5; IV Piggyback:55] Out: 2203 [Urine:1875; Drains:278; Blood:50] Intake/Output this shift:     Basename 10/09/12 0420  HGB 13.0    Basename 10/09/12 0420  WBC 11.9*  RBC 4.22  HCT 37.6*  PLT 175    Basename 10/09/12 0420  NA 134*  K 4.0  CL 99  CO2 25  BUN 13  CREATININE 0.89  GLUCOSE 142*  CALCIUM 8.2*   No results found for this basename: LABPT:2,INR:2 in the last 72 hours  Patient is conscious alert appropriate in bed appears to be in in no distress his lungs were clear his heart was regular his abdomen had good bowel sounds was soft nontender his right leg dressing was intact Hemovac drain was DC'd intact calf is soft and nontender she's got neuromotor vascularly intact feet.  Assessment/Plan: Postop day #1 status post right total knee arthroplasty doing well.  mild hyponatremia asymptomatic will adjust IV hydration  Plan out of bed with physical therapy and start his CPM today per protocol. Will decrease IV fluids. Dressing change in the a.m. We'll discharge home when independent with physical therapy medically and orthopedically ready     Jamelle Rushing 10/09/2012, 7:25 AM

## 2012-10-09 NOTE — Evaluation (Signed)
Occupational Therapy Evaluation Patient Details Name: [name] [name] MRN: [id number] DOB: 07-29-1946 Today's Date: 10/09/2012 Time:  -     OT Assessment / Plan / Recommendation Clinical Impression  Pt doing well POD 1 RTKR. Skilled OT recommended to maximize independence with BADLs to supervision level in prep for d/c home.    OT Assessment  Patient needs continued OT Services    Follow Up Recommendations  No OT follow up    Barriers to Discharge      Equipment Recommendations  3 in 1 bedside comode    Recommendations for Other Services    Frequency  Min 2X/week    Precautions / Restrictions Precautions Precautions: Knee Required Braces or Orthoses: Knee Immobilizer - Right Knee Immobilizer - Right: Discontinue once straight leg raise with < 10 degree lag Restrictions Weight Bearing Restrictions: No RLE Weight Bearing: Weight bearing as tolerated   Pertinent Vitals/Pain     ADL  Grooming: Min guard Where Assessed - Grooming: Supported standing Upper Body Bathing: Set up Where Assessed - Upper Body Bathing: Unsupported sitting Lower Body Bathing: Minimal assistance Where Assessed - Lower Body Bathing: Supported sit to stand Upper Body Dressing: Set up Where Assessed - Upper Body Dressing: Unsupported sitting Lower Body Dressing: Minimal assistance Where Assessed - Lower Body Dressing: Supported sit to Pharmacist, hospital: Minimal assistance Toilet Transfer Method: Sit to Barista: Other (comment) Nurse, children's) Toileting - Clothing Manipulation and Hygiene: Minimal assistance Where Assessed - Glass blower/designer Manipulation and Hygiene: Sit to stand from 3-in-1 or toilet Equipment Used: Rolling walker Transfers/Ambulation Related to ADLs: Pt ambulated with minguard A and RW. Pt limited by dizziness.    OT Diagnosis: Generalized weakness  OT Problem List: Decreased activity tolerance;Decreased knowledge of use of DME or AE;Pain OT Treatment  Interventions: Self-care/ADL training;Therapeutic activities;DME and/or AE instruction;Patient/family education   OT Goals Acute Rehab OT Goals OT Goal Formulation: With patient/family Time For Goal Achievement: 10/16/12 Potential to Achieve Goals: Good ADL Goals Pt Will Perform Grooming: with supervision;Standing at sink ADL Goal: Grooming - Progress: Goal set today Pt Will Perform Lower Body Bathing: with supervision;Sit to stand from chair;Sit to stand from bed ADL Goal: Lower Body Bathing - Progress: Goal set today Pt Will Perform Lower Body Dressing: with supervision;Sit to stand from chair;Sit to stand from bed ADL Goal: Lower Body Dressing - Progress: Goal set today Pt Will Transfer to Toilet: with supervision;Ambulation;with DME ADL Goal: Toilet Transfer - Progress: Goal set today Pt Will Perform Toileting - Clothing Manipulation: with supervision;Sitting on 3-in-1 or toilet;Standing ADL Goal: Toileting - Clothing Manipulation - Progress: Goal set today Pt Will Perform Toileting - Hygiene: with supervision;Sit to stand from 3-in-1/toilet ADL Goal: Toileting - Hygiene - Progress: Goal set today Pt Will Perform Tub/Shower Transfer: with supervision;Shower transfer;Ambulation ADL Goal: Web designer - Progress: Goal set today  Visit Information  Assistance Needed: +1    Subjective Data  Subjective: I hope to get out of here tomorrow. Patient Stated Goal: Not asked.   Prior Functioning     Home Living Lives With: Spouse Available Help at Discharge: Family Type of Home: House Home Access: Stairs to enter Secretary/administrator of Steps: 6 Entrance Stairs-Rails: Right Home Layout: One level Bathroom Shower/Tub: Health visitor: Standard Home Adaptive Equipment: Crutches Prior Function Level of Independence: Independent Able to Take Stairs?: Yes Driving: Yes Vocation: Retired Musician: No difficulties Dominant Hand: Right          Vision/Perception  Cognition  Overall Cognitive Status: Appears within functional limits for tasks assessed/performed Arousal/Alertness: Awake/alert Orientation Level: Appears intact for tasks assessed Behavior During Session: [facility] for tasks performed    Extremity/Trunk Assessment Right Upper Extremity Assessment RUE ROM/Strength/Tone: [facility] for tasks assessed Left Upper Extremity Assessment LUE ROM/Strength/Tone: [facility] for tasks assessed Right Lower Extremity Assessment RLE ROM/Strength/Tone: Deficits;Unable to fully assess;Due to pain RLE ROM/Strength/Tone Deficits: ankle WFL; able to assist minimally with SLR Left Lower Extremity Assessment LLE ROM/Strength/Tone: [facility] for tasks assessed Trunk Assessment Trunk Assessment: Normal     Mobility Bed Mobility Bed Mobility: Supine to Sit Supine to Sit: 4: Min assist;HOB elevated;With rails Details for Bed Mobility Assistance: min A for RLE. Min cues for sequencing, hand placement and technique. Transfers Sit to Stand: 4: Min guard;5: Supervision;From chair/3-in-1 Stand to Sit: 4: Min guard;5: Supervision;To chair/3-in-1 Details for Transfer Assistance: cues for hand placement (pt wanted to stay in chair)     Shoulder Instructions     Exercise    Balance     End of Session OT - End of Session Activity Tolerance: Patient tolerated treatment well Patient left: in chair;with call bell/phone within reach  GO     Prabhnoor Ellenberger A OTR/L [phone] 10/09/2012, 2:43 PM

## 2012-10-09 NOTE — Care Management Note (Signed)
  Page 2 of 2   10/10/2012     12:29:25 PM   CARE MANAGEMENT NOTE 10/10/2012  Patient:  [name], [name]   Account Number:  [id number]  Date Initiated:  10/09/2012  Documentation initiated by:  Colleen Can  Subjective/Objective Assessment:   dx right knee osteoarthritis; total knee replacemnt     Action/Plan:   CM spoke with patient and spouse. Spouse is requesting [facility]. Pt will need rw and 3N1. spouse will be caregiver   Anticipated DC Date:  10/11/2012   Anticipated DC Plan:  HOME W HOME HEALTH SERVICES  In-house referral  NA      DC Planning Services  CM consult      PAC Choice  DURABLE MEDICAL EQUIPMENT  HOME HEALTH   Choice offered to / List presented to:  C-1 Patient   DME arranged  Levan Hurst      DME agency  [facility]     HH arranged  HH-2 PT      [facility] agency  [facility]   Status of service:  In process, will continue to follow Medicare Important Message given?   (If response is "NO", the following Medicare IM given date fields will be blank) Date Medicare IM given:   Date Additional Medicare IM given:    Discharge Disposition:    Per UR Regulation:  Reviewed for med. necessity/level of care/duration of stay  If discussed at Long Length of Stay Meetings, dates discussed:    Comments:  10/10/2012 Colleen Can BSN RN CCM [phone] Received call from [facility]; states they can provide HHpt services with start date of Saturday 02/01 if pt is discharged friday. Requested that face sheet, HH orders, op note and H&P be faxed to [phone]; confirmation received.   10/09/2012 Colleen Can BSN RN CCM [phone] TCT [facility]-spoke with on call RN who will check to see if in network. [facility] office is currently closed d/t inclement weather. She will call supervisor. [facility] notified of DME need. CM will follow

## 2012-10-10 LAB — CBC
HCT: 33.8 % — ABNORMAL LOW (ref 39.0–52.0)
Hemoglobin: 11.6 g/dL — ABNORMAL LOW (ref 13.0–17.0)
MCH: 30.8 pg (ref 26.0–34.0)
MCHC: 34.3 g/dL (ref 30.0–36.0)
MCV: 89.7 fL (ref 78.0–100.0)
Platelets: 132 10*3/uL — ABNORMAL LOW (ref 150–400)
RBC: 3.77 MIL/uL — ABNORMAL LOW (ref 4.22–5.81)
RDW: 13.2 % (ref 11.5–15.5)
WBC: 9.4 10*3/uL (ref 4.0–10.5)

## 2012-10-10 MED ORDER — DIPHENHYDRAMINE HCL 25 MG PO CAPS
25.0000 mg | ORAL_CAPSULE | Freq: Four times a day (QID) | ORAL | Status: DC | PRN
  Administered 2012-10-10 – 2012-10-11 (×2): 25 mg via ORAL
  Filled 2012-10-10 (×2): qty 1

## 2012-10-10 MED ORDER — MELOXICAM 15 MG PO TABS
15.0000 mg | ORAL_TABLET | Freq: Every day | ORAL | Status: DC
  Administered 2012-10-10 – 2012-10-11 (×2): 15 mg via ORAL
  Filled 2012-10-10 (×2): qty 1

## 2012-10-10 MED ORDER — HYDROMORPHONE HCL 2 MG PO TABS
2.0000 mg | ORAL_TABLET | ORAL | Status: DC | PRN
  Administered 2012-10-10 – 2012-10-11 (×7): 2 mg via ORAL
  Filled 2012-10-10: qty 1
  Filled 2012-10-10: qty 2
  Filled 2012-10-10 (×4): qty 1

## 2012-10-10 NOTE — Progress Notes (Signed)
Occupational Therapy Treatment Patient Details Name: [name] [name] MRN: [id number] DOB: 01-Jun-1946 Today's Date: 10/10/2012 Time: 1351-1406 OT Time Calculation (min): 15 min  OT Assessment / Plan / Recommendation Comments on Treatment Session Pt progressing well    Follow Up Recommendations  No OT follow up    Barriers to Discharge       Equipment Recommendations  3 in 1 bedside comode    Recommendations for Other Services    Frequency Min 2X/week   Plan Discharge plan remains appropriate    Precautions / Restrictions Precautions Precautions: Knee Precaution Comments: Instructed pt on KI use for amb and steps Required Braces or Orthoses: Knee Immobilizer - Right Knee Immobilizer - Right: Discontinue once straight leg raise with < 10 degree lag Restrictions Weight Bearing Restrictions: No RLE Weight Bearing: Weight bearing as tolerated   Pertinent Vitals/Pain Pt reported minimal pain in L knee. Repositioned and cold applied.    ADL  Toilet Transfer: Hydrographic surveyor Method: Sit to Barista: Comfort height toilet;Grab bars Tub/Shower Transfer: Insurance risk surveyor Method: Science writer: Walk in Scientist, research (physical sciences) Used: Rolling walker Transfers/Ambulation Related to ADLs: Pt ambulated to the bathroom with minguard A and RW. ADL Comments: Pt educated how to safely step into and out of a walk in shower with good return demo. Also educated on donning KI.    OT Diagnosis:    OT Problem List:   OT Treatment Interventions:     OT Goals ADL Goals ADL Goal: Toilet Transfer - Progress: Progressing toward goals ADL Goal: Tub/Shower Transfer - Progress: Progressing toward goals  Visit Information  Last OT Received On: 10/10/12 Assistance Needed: +1    Subjective Data  Subjective: I still can't lift up my leg.   Prior Functioning       Cognition  Overall Cognitive Status: Appears within functional  limits for tasks assessed/performed Arousal/Alertness: Awake/alert Orientation Level: Appears intact for tasks assessed Behavior During Session: [facility] for tasks performed    Mobility  Shoulder Instructions Bed Mobility Bed Mobility: Sit to Supine Supine to Sit: 4: Min assist Sit to Supine: 5: Supervision;HOB flat Details for Bed Mobility Assistance: educated pt how to use sheet as a leg lifter. Transfers Sit to Stand: 4: Min guard;With upper extremity assist;From chair/3-in-1;From toilet Stand to Sit: 4: Min guard;With upper extremity assist;To toilet;To bed Details for Transfer Assistance: Min cues for hand placement and technique.       Exercises     Balance     End of Session OT - End of Session Activity Tolerance: Patient tolerated treatment well Patient left: in bed;with call bell/phone within reach;with family/visitor present  GO     Adamariz Gillott A OTR/L [phone] 10/10/2012, 2:09 PM

## 2012-10-10 NOTE — Progress Notes (Signed)
Physical Therapy Treatment Patient Details Name: [name] [name] MRN: [id number] DOB: Jun 19, 1946 Today's Date: 10/10/2012 Time: 4098-1191 PT Time Calculation (min): 31 min  PT Assessment / Plan / Recommendation Comments on Treatment Session  POD # 2 pm session.  Assisted pt OOB to amb in hallway then performed steps with famuily present for education.  Pt plans to D/C to home tomorrow.    Follow Up Recommendations  Home health PT     Does the patient have the potential to tolerate intense rehabilitation     Barriers to Discharge        Equipment Recommendations  Rolling walker with 5" wheels    Recommendations for Other Services    Frequency 7X/week   Plan Discharge plan remains appropriate    Precautions / Restrictions Precautions Precautions: Knee Precaution Comments: Instructed pt on KI use for amb and steps  Required Braces or Orthoses: Knee Immobilizer - Right Knee Immobilizer - Right: Discontinue once straight leg raise with < 10 degree lag Restrictions Weight Bearing Restrictions: No RLE Weight Bearing: Weight bearing as tolerated   Pertinent Vitals/Pain C/o "tightness" R knee    Mobility  Bed Mobility Bed Mobility: Supine to Sit;Sit to Supine Supine to Sit: 4: Min assist Sit to Supine: 4: Min assist Details for Bed Mobility Assistance: Min Assist to support R LE off then back onto bed. Transfers Transfers: Sit to Stand;Stand to Sit Sit to Stand: 4: Min guard;From bed Stand to Sit: 4: Min guard;To bed Details for Transfer Assistance: Min cues for hand placement and technique Ambulation/Gait Ambulation/Gait Assistance: 4: Min guard Ambulation Distance (Feet): 158 Feet Assistive device: Rolling walker Ambulation/Gait Assistance Details: increased time 25% VC's to increase WB thru R LE and increase posture Gait Pattern: Step-to pattern;Decreased step length - right Gait velocity: decreased Stairs: Yes Stairs Assistance: 4: Min guard;4: Min assist Stairs  Assistance Details (indicate cue type and reason): with spouse and son present for education. 50% VC's on proper sequencing and safety using one rail and one crutch.  Also instructed on use of KI for increased support. Stair Management Technique: One rail Right;With crutches;Forwards Number of Stairs: 4          PT Goals                                         progressing    Visit Information  Last PT Received On: 10/10/12 Assistance Needed: +1    Subjective Data  Subjective: I feel alittle woozy Patient Stated Goal: home   Cognition  Overall Cognitive Status: Appears within functional limits for tasks assessed/performed Arousal/Alertness: Awake/alert Orientation Level: Appears intact for tasks assessed Behavior During Session: [facility] for tasks performed    Balance     End of Session PT - End of Session Equipment Utilized During Treatment: Gait belt;Right knee immobilizer Activity Tolerance: Patient tolerated treatment well Patient left: in bed;with call bell/phone within reach;with family/visitor present   Felecia Shelling  PTA [facility]  Acute  Rehab Pager      [phone]

## 2012-10-10 NOTE — Progress Notes (Signed)
Subjective: Patient feels like he is doing well had a good night last night worked well with physical therapy yesterday. His only issue is he starts but it's a little bit of the pain medicine or the Celebrex he did get himself up or later today by himself use the bathroom.   Objective: Vital signs in last 24 hours: Temp:  [97.3 F (36.3 C)-99.5 F (37.5 C)] 99.4 F (37.4 C) (01/30 0543) Pulse Rate:  [73-97] 82  (01/30 0543) Resp:  [16-20] 20  (01/30 0543) BP: (128-145)/(75-79) 132/77 mmHg (01/30 0543) SpO2:  [90 %-96 %] 90 % (01/30 0543)  Intake/Output from previous day: 01/29 0701 - 01/30 0700 In: 1682.1 [P.O.:1120; I.V.:562.1] Out: 1550 [Urine:1550] Intake/Output this shift: Total I/O In: 40 [P.O.:40] Out: 550 [Urine:550]   Basename 10/10/12 0433 10/09/12 0420  HGB 11.6* 13.0    Basename 10/10/12 0433 10/09/12 0420  WBC 9.4 11.9*  RBC 3.77* 4.22  HCT 33.8* 37.6*  PLT 132* 175    Basename 10/09/12 0420  NA 134*  K 4.0  CL 99  CO2 25  BUN 13  CREATININE 0.89  GLUCOSE 142*  CALCIUM 8.2*   No results found for this basename: LABPT:2,INR:2 in the last 72 hours  Patient's conscious alert appropriate appears to be in no distress sitting down his hospital bed and he does appear to be scratching all he has no signs or hives or rashes. His right leg dressing was taken down his wound is well approximate Steri-Strips no signs of infection no drainage no pressure blisters her calf and thigh were soft and nontender his leg was neuromotor vascularly intact a new dressing was applied  Assessment/Plan: Postop day #2 status post right total knee arthroplasty doing well   Plan patient will get out of bed today with physical therapy and continued CPM per protocol will DC IV hydration Hep-Lock IV cath. We'll change his pain medicine over to the lawn in to see if this is what is causing his itching we'll also get him off the Celebrex and he uses meloxicam at home which works well for  him so we'll get him on the possible discharge is tomorrow if he continues doing well   Jamelle Rushing 10/10/2012, 6:46 AM

## 2012-10-10 NOTE — Progress Notes (Signed)
Physical Therapy Treatment Patient Details Name: [name] [name] MRN: [id number] DOB: 1946-04-30 Today's Date: 10/10/2012 Time: 3086-5784 PT Time Calculation (min): 39 min  PT Assessment / Plan / Recommendation Comments on Treatment Session  POD # 2 am session R TKR.  Applied KI and instructed on use, assisted pt OOB to amb in hallway, then positioned in recliner to perform TKR TE's followed by ICE.  Py c/o increased groggyness "from Benadryl" stated spouse.    Follow Up Recommendations        Does the patient have the potential to tolerate intense rehabilitation     Barriers to Discharge        Equipment Recommendations  Rolling walker with 5" wheels    Recommendations for Other Services    Frequency 7X/week   Plan Discharge plan remains appropriate    Precautions / Restrictions Precautions Precautions: Knee Precaution Comments: Instructed pt on KI use for amb and steps Required Braces or Orthoses: Knee Immobilizer - Right Knee Immobilizer - Right: Discontinue once straight leg raise with < 10 degree lag Restrictions Weight Bearing Restrictions: No RLE Weight Bearing: Weight bearing as tolerated   Pertinent Vitals/Pain C/o 6/10 during TE's and 3/10 during gait ICE applied    Mobility  Bed Mobility Bed Mobility: Supine to Sit Supine to Sit: 4: Min assist Details for Bed Mobility Assistance: Min assist to support r LE off bed and increased time Transfers Transfers: Sit to Stand;Stand to Sit Sit to Stand: 4: Min assist;4: Min guard;From bed Stand to Sit: 4: Min assist;4: Min guard;To chair/3-in-1 Details for Transfer Assistance: More groggy "from Benadryl" stated spouse. 50% VC's on proper tech and hand placement Ambulation/Gait Ambulation/Gait Assistance: 4: Min guard Ambulation Distance (Feet): 95 Feet Assistive device: Rolling walker Ambulation/Gait Assistance Details: increased time 25% VC's to increase WB thru R LE and increase posture. Gait Pattern: Step-to  pattern;Decreased stance time - right;Trunk flexed Gait velocity: decreased    Exercises Total Joint Exercises Ankle Circles/Pumps: AROM;Both;10 reps;Supine Quad Sets: AROM;Both;10 reps;Supine Gluteal Sets: AROM;Both;10 reps;Supine Towel Squeeze: AROM;Both;10 reps;Supine Heel Slides: AAROM;Right;10 reps;Supine Hip ABduction/ADduction: AAROM;Right;10 reps;Supine Straight Leg Raises: AAROM;Right;10 reps;Supine    PT Goals                                     progressing    Visit Information  Last PT Received On: 10/10/12 Assistance Needed: +1    Subjective Data  Subjective: I feel a stitch coming out Patient Stated Goal: home   Cognition       Balance     End of Session PT - End of Session Equipment Utilized During Treatment: Gait belt;Right knee immobilizer Activity Tolerance: Patient limited by fatigue;Patient limited by pain Patient left: in chair;with call bell/phone within reach;with family/visitor present   Felecia Shelling  PTA [facility]  Acute  Rehab Pager      [phone]

## 2012-10-11 LAB — CBC
HCT: 31.7 % — ABNORMAL LOW (ref 39.0–52.0)
Hemoglobin: 11 g/dL — ABNORMAL LOW (ref 13.0–17.0)
MCH: 31 pg (ref 26.0–34.0)
MCHC: 34.7 g/dL (ref 30.0–36.0)
MCV: 89.3 fL (ref 78.0–100.0)
Platelets: 133 10*3/uL — ABNORMAL LOW (ref 150–400)
RBC: 3.55 MIL/uL — ABNORMAL LOW (ref 4.22–5.81)
RDW: 13 % (ref 11.5–15.5)
WBC: 8 10*3/uL (ref 4.0–10.5)

## 2012-10-11 MED ORDER — FERROUS SULFATE 325 (65 FE) MG PO TABS: 325.0000 mg | ORAL_TABLET | Freq: Three times a day (TID) | ORAL | Status: DC

## 2012-10-11 MED ORDER — METHOCARBAMOL 500 MG PO TABS: 500.0000 mg | ORAL_TABLET | Freq: Four times a day (QID) | ORAL | Status: DC | PRN

## 2012-10-11 MED ORDER — DSS 100 MG PO CAPS: 100.0000 mg | ORAL_CAPSULE | Freq: Two times a day (BID) | ORAL | Status: DC

## 2012-10-11 MED ORDER — HYDROMORPHONE HCL 2 MG PO TABS: 2.0000 mg | ORAL_TABLET | ORAL | Status: DC | PRN

## 2012-10-11 MED ORDER — ASPIRIN EC 325 MG PO TBEC: 325.0000 mg | DELAYED_RELEASE_TABLET | Freq: Two times a day (BID) | ORAL | Status: DC

## 2012-10-11 MED ORDER — POLYETHYLENE GLYCOL 3350 17 G PO PACK: 17.0000 g | PACK | Freq: Every day | ORAL | Status: DC | PRN

## 2012-10-11 NOTE — Progress Notes (Signed)
Physical Therapy Treatment Patient Details Name: [name] [name] MRN: [id number] DOB: 1946-06-25 Today's Date: 10/11/2012 Time: 7829-5621 PT Time Calculation (min): 24 min  PT Assessment / Plan / Recommendation Comments on Treatment Session  POD # 3 pt plans to D/C to home today with spouse.  Practiced steps again. Instructed on use of ICE.  Instructed on proper positioning R LE full extension with block to decrease R hip ext rotation.  Instructed on car transfers. Instructed on HEP and frequency.    Follow Up Recommendations  Home health PT     Does the patient have the potential to tolerate intense rehabilitation     Barriers to Discharge        Equipment Recommendations  None recommended by PT    Recommendations for Other Services    Frequency 7X/week   Plan Discharge plan remains appropriate    Precautions / Restrictions Precautions Precautions: Knee Precaution Comments: Instructed pt on KI use for amb and steps  Required Braces or Orthoses: Knee Immobilizer - Right Knee Immobilizer - Right: Discontinue once straight leg raise with < 10 degree lag Restrictions Weight Bearing Restrictions: No RLE Weight Bearing: Weight bearing as tolerated   Pertinent Vitals/Pain C/o "sorenessICE applied    Mobility  Bed Mobility Bed Mobility: Not assessed Details for Bed Mobility Assistance: Pt OOB in recliner Transfers Transfers: Sit to Stand;Stand to Sit Sit to Stand: 5: Supervision;From chair/3-in-1 Stand to Sit: 5: Supervision;To chair/3-in-1 Details for Transfer Assistance: <25% VC's on safety with turns  Ambulation/Gait Ambulation/Gait Assistance: 5: Supervision Ambulation Distance (Feet): 348 Feet Assistive device: Rolling walker Ambulation/Gait Assistance Details: <25% VC's on safety with turns and backward gait sequencing Gait Pattern: Step-to pattern Stairs: Yes Stairs Assistance: 4: Min guard;5: Supervision Stairs Assistance Details (indicate cue type and reason):  with spouse and < 25% Vc's on proper tech and safety using one rail and one crutch. Stair Management Technique: One rail Right;Forwards;With crutches Number of Stairs: 4      PT Goals                                    progressing    Visit Information  Last PT Received On: 10/11/12    Subjective Data      Cognition       Balance     End of Session PT - End of Session Equipment Utilized During Treatment: Gait belt;Right knee immobilizer Activity Tolerance: Patient tolerated treatment well Patient left: in bed;with call bell/phone within reach Nurse Communication: Mobility status   Felecia Shelling  PTA [facility]  Acute  Rehab Pager      [phone]

## 2012-10-11 NOTE — Progress Notes (Signed)
Subjective: Patient feels like he is doing real well today. He is sitting in a bedside chair. No shortness of breath or chest pain. He does feel that dilaudid taken care of his pain better. He just had an issue when he took Benadryl along with the Dilaudid making him extremely drowsy   Objective: Vital signs in last 24 hours: Temp:  [98.5 F (36.9 C)-99.2 F (37.3 C)] 99.2 F (37.3 C) (01/31 0516) Pulse Rate:  [72-87] 84  (01/31 0516) Resp:  [16] 16  (01/31 0516) BP: (133-156)/(66-70) 156/68 mmHg (01/31 0516) SpO2:  [94 %-95 %] 95 % (01/31 0516)  Intake/Output from previous day: 01/30 0701 - 01/31 0700 In: 840 [P.O.:840] Out: 3 [Urine:3] Intake/Output this shift: Total I/O In: 360 [P.O.:360] Out: -    Basename 10/11/12 0428 10/10/12 0433 10/09/12 0420  HGB 11.0* 11.6* 13.0    Basename 10/11/12 0428 10/10/12 0433  WBC 8.0 9.4  RBC 3.55* 3.77*  HCT 31.7* 33.8*  PLT 133* 132*    Basename 10/09/12 0420  NA 134*  K 4.0  CL 99  CO2 25  BUN 13  CREATININE 0.89  GLUCOSE 142*  CALCIUM 8.2*   No results found for this basename: LABPT:2,INR:2 in the last 72 hours  Patient is conscious alert and appropriate appears to be extremely comfortable sitting in a bedside recliner chair no signs of the stress. He does have his leg elevated with a cushion under his ankle his right knee wound is well approximate Steri-Strips no signs of infection drainage pressure blisters his thigh is a little sore his calf is soft and nontender his leg is neuromotor vascularly intact  Assessment/Plan: Postop day #3 status post right total knee arthroplasty doing real well. Thigh soreness at the tourniquet site no signs of DVT Expected postop blood loss asymptomatic we'll allow to self correct by mouth supplementation  Plan out of bed with physical therapy this morning we'll discharge home with home health physical therapy arranged. CPM is previously been arranged. Prescriptions provided and patient  will followup with Dr. Thomasena Edis in 2 weeks   [name] [name] 10/11/2012, 6:45 AM

## 2012-10-11 NOTE — Discharge Summary (Signed)
Physician Discharge Summary  Patient ID: [name] [name] MRN: [id number] DOB/AGE: 1946/08/19 67 y.o.  Admit date: 10/08/2012 Discharge date: 10/11/2012  Admission Diagnoses: End-stage osteoarthritis right knee  Discharge Diagnoses: Status post right total knee arthroplasty Expected postoperative blood loss anemia asymptomatic rollout self correct with by mouth    Discharged Condition: good  Hospital Course: Patient was admitted to [facility] the care of Dr. Valma Cava. Patient was taken to the OR were a right total knee arthroplasty performed under spinal anesthesia. Patient had no complications intraoperatively he had a Hemovac drain left in place was transferred to the recovery room then to the orthopedic floor in good condition follow total knee protocol. Patient was going to be on Lovenox while it was in the hospital and then converted to aspirin as an outpatient. Postop day #1 patient was doing real well with just a little bit difficult time with sleeping pain was well-controlled. Hemovac drain was DC'd intact. Patient was initiated physical therapy and CPM. Postop day #2 his dressing was taken down his wound was benign for any signs of infection was well approximated with Steri-Strips no drainage no pressure blisters no signs of infection his calf and leg was neuromotor vascularly intact. Patient was able to tolerate his physical therapy and CPM while. Patient was having a little bit of itching with his Percocet so was changed over to Dilaudid. He was also taken off the Celebrex and restarted his meloxicam which he takes on a daily basis at home. He continued with physical therapy per protocol. Postop day #3 patient was doing real well he progressed nicely with physical therapy was medically and ready to for discharge home with home health instructions prescriptions provided. His wound was benign for any signs of infection his leg was neuromotor vascularly intact.  Consults:  None  Significant Diagnostic Studies: Routine total knee labs  Treatments: Routine total knee protocol  Discharge Exam: Blood pressure 156/68, pulse 84, temperature 99.2 F (37.3 C), temperature source Oral, resp. rate 16, height 6\' 4"  (1.93 m), weight 122.471 kg (270 lb), SpO2 95.00%. Patient is conscious alert and appropriate sitting up in a bedside chair no distress. His right leg wound is well approximated with Steri-Strips no sign of drainage no pressure blisters no signs of infection his thigh is a little bit sore at the site of the tourniquet he has no bruising he is no signs of DVT. His calf is soft nontender his leg is neuromotor vascularly intact.  Disposition: To home with home health physical therapy  Discharge Orders    Future Orders Please Complete By Expires   Diet general      Call MD / Call 911      Comments:   If you experience chest pain or shortness of breath, CALL 911 and be transported to the hospital emergency room.  If you develope a fever above 101 F, pus (white drainage) or increased drainage or redness at the wound, or calf pain, call your surgeon's office.   Increase activity slowly as tolerated      Discharge instructions      Comments:   Call [phone] for a followup appointment with Dr. Thomasena Edis 2 weeks   CPM      Comments:   Continuous passive motion machine (CPM):      Use the CPM from 0 to 50 for 6 hours per day.      You may increase by 10 per day.  You may break it up into  2 or 3 sessions per day.      Use CPM for 2 weeks or until you are told to stop.   TED hose      Comments:   Use stockings (TED hose) for 3 weeks on both leg(s).  You may remove them at night for sleeping.   Change dressing      Comments:   Change dressing daily with sterile 4 x 4 inch gauze dressing and apply TED hose.  You may clean the incision with alcohol prior to redressing.   Do not put a pillow under the knee. Place it under the heel.          Medication List     As  of 10/11/2012  6:57 AM    STOP taking these medications         traMADol 50 MG tablet   Commonly known as: ULTRAM      TAKE these medications         acetaminophen 500 MG tablet   Commonly known as: TYLENOL   Take 1,500 mg by mouth every 6 (six) hours as needed. PAIN      aspirin EC 325 MG tablet   Take 1 tablet (325 mg total) by mouth 2 (two) times daily.      DSS 100 MG Caps   Take 100 mg by mouth 2 (two) times daily.      ferrous sulfate 325 (65 FE) MG tablet   Take 1 tablet (325 mg total) by mouth 3 (three) times daily after meals.      HYDROmorphone 2 MG tablet   Commonly known as: DILAUDID   Take 1-2 tablets (2-4 mg total) by mouth every 3 (three) hours as needed.      meloxicam 15 MG tablet   Commonly known as: MOBIC   Take 15 mg by mouth daily.      methocarbamol 500 MG tablet   Commonly known as: ROBAXIN   Take 1 tablet (500 mg total) by mouth every 6 (six) hours as needed.      polyethylene glycol packet   Commonly known as: MIRALAX / GLYCOLAX   Take 17 g by mouth daily as needed.         SignedJamelle Rushing 10/11/2012, 6:57 AM

## 2012-10-11 NOTE — Progress Notes (Signed)
CARE MANAGEMENT NOTE 10/11/2012  Patient:  [name] [name], [name] [name]   Account Number:  [id number]  Date Initiated:  10/09/2012  Documentation initiated by:  Colleen Can  Subjective/Objective Assessment:   dx right knee osteoarthritis; total knee replacemnt     Action/Plan:   CM spoke with patient and spouse. Spouse is requesting [facility]. Pt will need rw and 3N1. spouse will be caregiver   Anticipated DC Date:  10/11/2012   Anticipated DC Plan:  HOME W HOME HEALTH SERVICES  In-house referral  NA      DC Planning Services  CM consult      PAC Choice  DURABLE MEDICAL EQUIPMENT  HOME HEALTH   Choice offered to / List presented to:  C-1 Patient   DME arranged  Levan Hurst      DME agency  [facility]     HH arranged  HH-2 PT      [facility] agency  [facility]   Status of service:  Completed, signed off Medicare Important Message given?  NO (If response is "NO", the following Medicare IM given date fields will be blank) Date Medicare IM given:   Date Additional Medicare IM given:    Discharge Disposition:  HOME W HOME HEALTH SERVICES  Per UR Regulation:  Reviewed for med. necessity/level of care/duration of stay  If discussed at Long Length of Stay Meetings, dates discussed:    Comments:  10/11/2012 Colleen Can BSN RN CCM [phone] Pt for discharge today. [facility] -Becky notified of pt's discharge. Discharge summary faxed to [phone] her request. Services will start tomorrow 10/12/2012. Pt states he did not need 3n1; RW was delivered to pt's room.

## 2012-10-29 ENCOUNTER — Ambulatory Visit: Payer: BC Managed Care – PPO | Attending: Specialist | Admitting: Physical Therapy

## 2012-10-29 DIAGNOSIS — M25569 Pain in unspecified knee: Secondary | ICD-10-CM | POA: Insufficient documentation

## 2012-10-29 DIAGNOSIS — IMO0001 Reserved for inherently not codable concepts without codable children: Secondary | ICD-10-CM | POA: Insufficient documentation

## 2012-10-29 DIAGNOSIS — Z96659 Presence of unspecified artificial knee joint: Secondary | ICD-10-CM | POA: Insufficient documentation

## 2012-10-31 ENCOUNTER — Ambulatory Visit: Payer: BC Managed Care – PPO

## 2012-11-04 ENCOUNTER — Ambulatory Visit: Payer: BC Managed Care – PPO | Admitting: Physical Therapy

## 2012-11-06 ENCOUNTER — Ambulatory Visit: Payer: BC Managed Care – PPO | Admitting: Physical Therapy

## 2012-11-07 ENCOUNTER — Ambulatory Visit: Payer: BC Managed Care – PPO | Admitting: Physical Therapy

## 2012-11-11 ENCOUNTER — Ambulatory Visit: Payer: BC Managed Care – PPO | Attending: Orthopedic Surgery | Admitting: Physical Therapy

## 2012-11-11 DIAGNOSIS — M25569 Pain in unspecified knee: Secondary | ICD-10-CM | POA: Insufficient documentation

## 2012-11-11 DIAGNOSIS — IMO0001 Reserved for inherently not codable concepts without codable children: Secondary | ICD-10-CM | POA: Insufficient documentation

## 2012-11-12 ENCOUNTER — Telehealth: Payer: Self-pay | Admitting: *Deleted

## 2012-11-12 NOTE — Telephone Encounter (Signed)
Opened in error

## 2012-11-13 ENCOUNTER — Ambulatory Visit: Payer: BC Managed Care – PPO | Admitting: Physical Therapy

## 2012-11-14 ENCOUNTER — Ambulatory Visit: Payer: BC Managed Care – PPO | Admitting: Physical Therapy

## 2012-11-18 ENCOUNTER — Ambulatory Visit: Payer: BC Managed Care – PPO

## 2012-11-20 ENCOUNTER — Ambulatory Visit: Payer: BC Managed Care – PPO | Admitting: Physical Therapy

## 2012-11-21 ENCOUNTER — Ambulatory Visit: Payer: BC Managed Care – PPO | Admitting: Physical Therapy

## 2012-11-26 ENCOUNTER — Ambulatory Visit: Payer: BC Managed Care – PPO | Admitting: Physical Therapy

## 2012-11-28 ENCOUNTER — Ambulatory Visit: Payer: BC Managed Care – PPO | Admitting: Physical Therapy

## 2012-12-03 ENCOUNTER — Encounter: Payer: BC Managed Care – PPO | Admitting: Physical Therapy

## 2012-12-05 ENCOUNTER — Ambulatory Visit: Payer: BC Managed Care – PPO | Admitting: Physical Therapy

## 2013-04-03 ENCOUNTER — Encounter (HOSPITAL_COMMUNITY): Payer: Self-pay | Admitting: Emergency Medicine

## 2013-04-03 ENCOUNTER — Emergency Department (HOSPITAL_COMMUNITY)
Admission: EM | Admit: 2013-04-03 | Discharge: 2013-04-03 | Disposition: A | Discharge status: Home / Self Care | Payer: BC Managed Care – PPO | Attending: Emergency Medicine | Admitting: Emergency Medicine

## 2013-04-03 ENCOUNTER — Emergency Department (HOSPITAL_COMMUNITY): Payer: BC Managed Care – PPO

## 2013-04-03 DIAGNOSIS — Z79899 Other long term (current) drug therapy: Secondary | ICD-10-CM | POA: Insufficient documentation

## 2013-04-03 DIAGNOSIS — R1110 Vomiting, unspecified: Secondary | ICD-10-CM | POA: Insufficient documentation

## 2013-04-03 DIAGNOSIS — N289 Disorder of kidney and ureter, unspecified: Secondary | ICD-10-CM

## 2013-04-03 DIAGNOSIS — Z87442 Personal history of urinary calculi: Secondary | ICD-10-CM | POA: Insufficient documentation

## 2013-04-03 DIAGNOSIS — Z791 Long term (current) use of non-steroidal anti-inflammatories (NSAID): Secondary | ICD-10-CM | POA: Insufficient documentation

## 2013-04-03 DIAGNOSIS — N201 Calculus of ureter: Secondary | ICD-10-CM

## 2013-04-03 HISTORY — DX: Calculus of kidney: N20.0

## 2013-04-03 LAB — COMPREHENSIVE METABOLIC PANEL
ALT: 26 U/L (ref 0–53)
AST: 24 U/L (ref 0–37)
Albumin: 4.4 g/dL (ref 3.5–5.2)
Alkaline Phosphatase: 59 U/L (ref 39–117)
BUN: 28 mg/dL — ABNORMAL HIGH (ref 6–23)
CO2: 27 mEq/L (ref 19–32)
Calcium: 9.4 mg/dL (ref 8.4–10.5)
Chloride: 99 mEq/L (ref 96–112)
Creatinine, Ser: 1.99 mg/dL — ABNORMAL HIGH (ref 0.50–1.35)
GFR calc Af Amer: 39 mL/min — ABNORMAL LOW (ref >90)
GFR calc non Af Amer: 34 mL/min — ABNORMAL LOW (ref >90)
Glucose, Bld: 136 mg/dL — ABNORMAL HIGH (ref 70–99)
Potassium: 4.5 mEq/L (ref 3.5–5.1)
Sodium: 136 mEq/L (ref 135–145)
Total Bilirubin: 0.7 mg/dL (ref 0.3–1.2)
Total Protein: 7.4 g/dL (ref 6.0–8.3)

## 2013-04-03 LAB — CBC WITH DIFFERENTIAL/PLATELET
Basophils Absolute: 0 10*3/uL (ref 0.0–0.1)
Basophils Relative: 0 % (ref 0–1)
Eosinophils Absolute: 0.1 10*3/uL (ref 0.0–0.7)
Eosinophils Relative: 1 % (ref 0–5)
HCT: 43.7 % (ref 39.0–52.0)
Hemoglobin: 15.3 g/dL (ref 13.0–17.0)
Lymphocytes Relative: 11 % — ABNORMAL LOW (ref 12–46)
Lymphs Abs: 1.1 10*3/uL (ref 0.7–4.0)
MCH: 30.1 pg (ref 26.0–34.0)
MCHC: 35 g/dL (ref 30.0–36.0)
MCV: 85.9 fL (ref 78.0–100.0)
Monocytes Absolute: 0.9 10*3/uL (ref 0.1–1.0)
Monocytes Relative: 9 % (ref 3–12)
Neutro Abs: 8.3 10*3/uL — ABNORMAL HIGH (ref 1.7–7.7)
Neutrophils Relative %: 80 % — ABNORMAL HIGH (ref 43–77)
Platelets: 157 10*3/uL (ref 150–400)
RBC: 5.09 MIL/uL (ref 4.22–5.81)
RDW: 13.8 % (ref 11.5–15.5)
WBC: 10.4 10*3/uL (ref 4.0–10.5)

## 2013-04-03 LAB — URINALYSIS, ROUTINE W REFLEX MICROSCOPIC
Bilirubin Urine: NEGATIVE
Glucose, UA: NEGATIVE mg/dL
Hgb urine dipstick: NEGATIVE
Ketones, ur: NEGATIVE mg/dL
Leukocytes, UA: NEGATIVE
Nitrite: NEGATIVE
Protein, ur: NEGATIVE mg/dL
Specific Gravity, Urine: 1.016 (ref 1.005–1.030)
Urobilinogen, UA: 0.2 mg/dL (ref 0.0–1.0)
pH: 6 (ref 5.0–8.0)

## 2013-04-03 LAB — LIPASE, BLOOD: Lipase: 21 U/L (ref 11–59)

## 2013-04-03 MED ORDER — ONDANSETRON HCL 4 MG PO TABS: 4.0000 mg | ORAL_TABLET | Freq: Four times a day (QID) | ORAL | Status: DC

## 2013-04-03 MED ORDER — HYDROMORPHONE HCL PF 1 MG/ML IJ SOLN
1.0000 mg | Freq: Once | INTRAMUSCULAR | Status: AC
  Administered 2013-04-03: 1 mg via INTRAVENOUS
  Filled 2013-04-03: qty 1

## 2013-04-03 MED ORDER — SODIUM CHLORIDE 0.9 % IV SOLN
1000.0000 mL | Freq: Once | INTRAVENOUS | Status: AC
  Administered 2013-04-03: 1000 mL via INTRAVENOUS

## 2013-04-03 MED ORDER — OXYCODONE-ACETAMINOPHEN 5-325 MG PO TABS: 1.0000 | ORAL_TABLET | Freq: Four times a day (QID) | ORAL | Status: DC | PRN

## 2013-04-03 MED ORDER — ONDANSETRON HCL 4 MG/2ML IJ SOLN: 4.0000 mg | Freq: Once | INTRAMUSCULAR | Status: DC

## 2013-04-03 MED ORDER — ONDANSETRON HCL 4 MG/2ML IJ SOLN
4.0000 mg | Freq: Once | INTRAMUSCULAR | Status: AC
  Administered 2013-04-03: 4 mg via INTRAVENOUS
  Filled 2013-04-03: qty 2

## 2013-04-03 NOTE — ED Provider Notes (Signed)
History    CSN: [id number] Arrival date & time 04/03/13  8295  First MD Initiated Contact with Patient 04/03/13 [phone]     Chief Complaint  Patient presents with  . Flank Pain   (Consider location/radiation/quality/duration/timing/severity/associated sxs/prior Treatment) HPI Pt presents with c/o right sided flank pain which has been ongoing over the past 3 days.  He was seen by his PMD 3 days ago and given rx for oxycodone and phenergan.  He states the pain was improved somewhat, then increased last night and this morning.  The medications did not help much.  He vomited just prior to arrival to the ED.  No fever/chills.  Pain was 9/10 on arrival.  He has hx of prior kidney stones and this feels similar.  There are no other associated systemic symptoms, there are no other alleviating or modifying factors.  Past Medical History  Diagnosis Date  . Kidney stones   . Kidney stone    History reviewed. No pertinent past surgical history. History reviewed. No pertinent family history. History  Substance Use Topics  . Smoking status: Never Smoker   . Smokeless tobacco: Not on file  . Alcohol Use: No    Review of Systems ROS reviewed and all otherwise negative except for mentioned in HPI  Allergies  Review of patient's allergies indicates no known allergies.  Home Medications   Current Outpatient Rx  Name  Route  Sig  Dispense  Refill  . meloxicam (MOBIC) 7.5 MG tablet   Oral   Take 7.5 mg by mouth daily.         Marland Kitchen oxyCODONE (OXYCONTIN) 10 MG 12 hr tablet   Oral   Take 10 mg by mouth every 12 (twelve) hours.         . promethazine (PHENERGAN) 25 MG tablet   Oral   Take 25 mg by mouth every 6 (six) hours as needed for nausea.         . traMADol (ULTRAM) 50 MG tablet   Oral   Take 50 mg by mouth every 6 (six) hours as needed for pain.         Marland Kitchen ondansetron (ZOFRAN) 4 MG tablet   Oral   Take 1 tablet (4 mg total) by mouth every 6 (six) hours.   12 tablet   0   .  oxyCODONE-acetaminophen (PERCOCET/ROXICET) 5-325 MG per tablet   Oral   Take 1-2 tablets by mouth every 6 (six) hours as needed for pain.   20 tablet   0    BP 158/75  Pulse 56  Temp(Src) 97.5 F (36.4 C) (Oral)  Resp 16  SpO2 93% Vitals reviewed Physical Exam  Physical Examination: General appearance - alert, well appearing, and in no distress Mental status - alert, oriented to person, place, and time Eyes - no scleral icterus, no conjunctival injection Chest - clear to auscultation, no wheezes, rales or rhonchi, symmetric air entry Heart - normal rate, regular rhythm, normal S1, S2, no murmurs, rubs, clicks or gallops Abdomen - soft, nontender, nondistended, no masses or organomegaly Back exam - full range of motion, no midline tenderness to palpation, right CVA tenderness Extremities - peripheral pulses normal, no pedal edema, no clubbing or cyanosis Skin - normal coloration and turgor, no rashes  ED Course  Procedures (including critical care time)  8:42 AM d/w CT results as well as increased creatinine with Dr. Vernie Ammons, urology.  He recommends patient have outpatient followup in their office.  I have encouraged  patient to call today to be seen tomorrow.  All results and plan d/w patient and wife at bedside- they are agreeable.  pts pain is much improved after dilaudid.   Labs Reviewed  CBC WITH DIFFERENTIAL - Abnormal; Notable for the following:    Neutrophils Relative % 80 (*)    Neutro Abs 8.3 (*)    Lymphocytes Relative 11 (*)    All other components within normal limits  COMPREHENSIVE METABOLIC PANEL - Abnormal; Notable for the following:    Glucose, Bld 136 (*)    BUN 28 (*)    Creatinine, Ser 1.99 (*)    GFR calc non Af Amer 34 (*)    GFR calc Af Amer 39 (*)    All other components within normal limits  URINALYSIS, ROUTINE W REFLEX MICROSCOPIC  LIPASE, BLOOD   Ct Abdomen Pelvis Wo Contrast  04/03/2013   *RADIOLOGY REPORT*  Clinical Data: Right flank pain since  Monday, history of kidney stones  CT ABDOMEN AND PELVIS WITHOUT CONTRAST  Technique:  Multidetector CT imaging of the abdomen and pelvis was performed following the standard protocol without intravenous contrast.  Comparison: None.  Findings: The lung bases are clear.  The liver is unremarkable in the unenhanced state.  Surgical clips are present from prior cholecystectomy.  The pancreas is normal in size and the pancreatic duct is not dilated.  The adrenal glands and spleen are unremarkable.  The stomach is not well distended but no abnormality is evident.  There is a moderate right hydronephrosis present caused by a 5 x 6 x 7 mm proximal right ureteral calculus.  Only a tiny 2 mm calculus is noted in the lower pole of the right kidney as well.  No left renal calculi are seen.  The abdominal aorta is normal in caliber. No adenopathy is noted.  The more distal ureters are normal in caliber and no distal ureteral calculus is noted.  The urinary bladder is moderately urine distended with no abnormality evident.  The prostate is slightly prominent measuring 5.1 x 5.4 cm.  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  The terminal ileum is unremarkable.  The appendix is better seen on the coronal images, filling with air.  No inflammatory process is seen.  There are degenerative changes throughout the facet joints of the lower lumbar spine.  IMPRESSION:  1.  Moderate right hydronephrosis caused by a 5 x 6 x 7 mm proximal right ureteral calculus. 2.  2 mm nonobstructing calculus in the lower pole of the right kidney.  No left renal calculi are seen. 3.  Multiple rectosigmoid and descending colon diverticula. 4.  Prominent prostate.   Original Report Authenticated By: Dwyane Dee, M.D.   1. Ureteral stone   2. Renal insufficiency     MDM  Pt presenting with c/o right sided flank pain.  CT shows proximal renal stone and some renal  insufficiency.  No sign of UTI on urinalysis.  D/w urology- see notes above.  Discharged with strict return precautions.  Pt agreeable with plan.  Ethelda Chick, MD 04/03/13 [phone]

## 2013-04-03 NOTE — ED Notes (Signed)
Pt stated that he began having R sided flank pain on Monday and went to his PCP and was given Oxycodone and Promethazine.  Pt has a hx of kidney stones and was told this was probably the case again, based on a urine sample.  Pt stated that the pain and nausea subsided until this morning.  Pt currently vomiting.

## 2013-04-04 ENCOUNTER — Ambulatory Visit (HOSPITAL_COMMUNITY): Payer: BC Managed Care – PPO | Admitting: Anesthesiology

## 2013-04-04 ENCOUNTER — Other Ambulatory Visit: Payer: Self-pay | Admitting: Urology

## 2013-04-04 ENCOUNTER — Encounter (HOSPITAL_COMMUNITY)
Admission: RE | Disposition: A | Discharge status: Home / Self Care | Payer: Self-pay | Source: Ambulatory Visit | Attending: Urology

## 2013-04-04 ENCOUNTER — Encounter (HOSPITAL_COMMUNITY): Payer: Self-pay | Admitting: Anesthesiology

## 2013-04-04 ENCOUNTER — Ambulatory Visit (HOSPITAL_COMMUNITY)
Admission: RE | Admit: 2013-04-04 | Discharge: 2013-04-04 | Disposition: A | Discharge status: Home / Self Care | Payer: BC Managed Care – PPO | Source: Ambulatory Visit | Attending: Urology | Admitting: Urology

## 2013-04-04 ENCOUNTER — Encounter (HOSPITAL_COMMUNITY): Payer: Self-pay | Admitting: *Deleted

## 2013-04-04 DIAGNOSIS — N201 Calculus of ureter: Secondary | ICD-10-CM | POA: Insufficient documentation

## 2013-04-04 DIAGNOSIS — R112 Nausea with vomiting, unspecified: Secondary | ICD-10-CM | POA: Insufficient documentation

## 2013-04-04 DIAGNOSIS — N400 Enlarged prostate without lower urinary tract symptoms: Secondary | ICD-10-CM | POA: Insufficient documentation

## 2013-04-04 DIAGNOSIS — Z9089 Acquired absence of other organs: Secondary | ICD-10-CM | POA: Insufficient documentation

## 2013-04-04 DIAGNOSIS — M129 Arthropathy, unspecified: Secondary | ICD-10-CM | POA: Insufficient documentation

## 2013-04-04 DIAGNOSIS — N179 Acute kidney failure, unspecified: Secondary | ICD-10-CM | POA: Insufficient documentation

## 2013-04-04 DIAGNOSIS — Z96659 Presence of unspecified artificial knee joint: Secondary | ICD-10-CM | POA: Insufficient documentation

## 2013-04-04 DIAGNOSIS — N1330 Unspecified hydronephrosis: Secondary | ICD-10-CM | POA: Insufficient documentation

## 2013-04-04 HISTORY — PX: OTHER SURGICAL HISTORY: SHX169

## 2013-04-04 HISTORY — PX: CYSTOSCOPY WITH RETROGRADE PYELOGRAM, URETEROSCOPY AND STENT PLACEMENT: SHX5789

## 2013-04-04 SURGERY — CYSTOURETEROSCOPY, WITH RETROGRADE PYELOGRAM AND STENT INSERTION
Anesthesia: General | Site: Pelvis | Laterality: Right | Wound class: Clean Contaminated

## 2013-04-04 MED ORDER — HYDROMORPHONE HCL PF 1 MG/ML IJ SOLN
1.0000 mg | INTRAMUSCULAR | Status: AC
  Administered 2013-04-04 (×2): 1 mg via INTRAVENOUS
  Filled 2013-04-04 (×2): qty 1

## 2013-04-04 MED ORDER — FENTANYL CITRATE 0.05 MG/ML IJ SOLN: 25.0000 ug | INTRAMUSCULAR | Status: DC | PRN

## 2013-04-04 MED ORDER — ACETAMINOPHEN 325 MG PO TABS
ORAL_TABLET | ORAL | Status: AC
  Filled 2013-04-04: qty 2

## 2013-04-04 MED ORDER — LIDOCAINE HCL 2 % EX GEL
CUTANEOUS | Status: AC
  Filled 2013-04-04: qty 10

## 2013-04-04 MED ORDER — SODIUM CHLORIDE 0.9 % IR SOLN
Status: DC | PRN
  Administered 2013-04-04: 3000 mL

## 2013-04-04 MED ORDER — IOHEXOL 300 MG/ML  SOLN
INTRAMUSCULAR | Status: AC
  Filled 2013-04-04: qty 1

## 2013-04-04 MED ORDER — OXYCODONE-ACETAMINOPHEN 7.5-325 MG PO TABS: 1.0000 | ORAL_TABLET | ORAL | Status: DC | PRN

## 2013-04-04 MED ORDER — SENNOSIDES-DOCUSATE SODIUM 8.6-50 MG PO TABS: 1.0000 | ORAL_TABLET | Freq: Two times a day (BID) | ORAL | Status: DC

## 2013-04-04 MED ORDER — LIDOCAINE HCL (CARDIAC) 20 MG/ML IV SOLN
INTRAVENOUS | Status: DC | PRN
  Administered 2013-04-04: 30 mg via INTRAVENOUS

## 2013-04-04 MED ORDER — MIDAZOLAM HCL 5 MG/5ML IJ SOLN
INTRAMUSCULAR | Status: DC | PRN
  Administered 2013-04-04 (×2): 1 mg via INTRAVENOUS

## 2013-04-04 MED ORDER — HYDROMORPHONE HCL PF 1 MG/ML IJ SOLN: 1.0000 mg | INTRAMUSCULAR | Status: AC

## 2013-04-04 MED ORDER — OXYBUTYNIN CHLORIDE 5 MG PO TABS: 5.0000 mg | ORAL_TABLET | Freq: Three times a day (TID) | ORAL | Status: DC | PRN

## 2013-04-04 MED ORDER — FENTANYL CITRATE 0.05 MG/ML IJ SOLN
INTRAMUSCULAR | Status: DC | PRN
  Administered 2013-04-04: 50 ug via INTRAVENOUS
  Administered 2013-04-04: 100 ug via INTRAVENOUS

## 2013-04-04 MED ORDER — KETAMINE HCL 10 MG/ML IJ SOLN
INTRAMUSCULAR | Status: DC | PRN
  Administered 2013-04-04: 25 mg via INTRAVENOUS

## 2013-04-04 MED ORDER — ONDANSETRON HCL 4 MG/2ML IJ SOLN
4.0000 mg | INTRAMUSCULAR | Status: DC
  Administered 2013-04-04: 4 mg via INTRAVENOUS
  Filled 2013-04-04: qty 2

## 2013-04-04 MED ORDER — EPHEDRINE SULFATE 50 MG/ML IJ SOLN
INTRAMUSCULAR | Status: DC | PRN
  Administered 2013-04-04: 10 mg via INTRAVENOUS

## 2013-04-04 MED ORDER — LACTATED RINGERS IV SOLN
INTRAVENOUS | Status: DC
  Administered 2013-04-04: 1000 mL via INTRAVENOUS

## 2013-04-04 MED ORDER — 0.9 % SODIUM CHLORIDE (POUR BTL) OPTIME
TOPICAL | Status: DC | PRN
  Administered 2013-04-04: 1000 mL

## 2013-04-04 MED ORDER — DEXTROSE-NACL 5-0.45 % IV SOLN
INTRAVENOUS | Status: DC
  Administered 2013-04-04 (×2): via INTRAVENOUS

## 2013-04-04 MED ORDER — LACTATED RINGERS IV SOLN
INTRAVENOUS | Status: DC | PRN
  Administered 2013-04-04: 17:00:00 via INTRAVENOUS

## 2013-04-04 MED ORDER — IOHEXOL 300 MG/ML  SOLN
INTRAMUSCULAR | Status: DC | PRN
  Administered 2013-04-04: 15 mL via INTRAVENOUS

## 2013-04-04 MED ORDER — SODIUM CHLORIDE 0.9 % IR SOLN
Status: DC | PRN
  Administered 2013-04-04: 1000 mL

## 2013-04-04 MED ORDER — DEXTROSE 5 % IV SOLN
1.0000 g | Freq: Once | INTRAVENOUS | Status: AC
  Administered 2013-04-04: 1 g via INTRAVENOUS
  Filled 2013-04-04: qty 10

## 2013-04-04 MED ORDER — PROMETHAZINE HCL 25 MG/ML IJ SOLN: 6.2500 mg | INTRAMUSCULAR | Status: DC | PRN

## 2013-04-04 MED ORDER — PROPOFOL 10 MG/ML IV BOLUS
INTRAVENOUS | Status: DC | PRN
  Administered 2013-04-04: 200 mg via INTRAVENOUS

## 2013-04-04 MED ORDER — ACETAMINOPHEN 325 MG PO TABS
650.0000 mg | ORAL_TABLET | Freq: Once | ORAL | Status: AC
  Administered 2013-04-04: 650 mg via ORAL

## 2013-04-04 MED ORDER — ONDANSETRON HCL 4 MG PO TABS: 4.0000 mg | ORAL_TABLET | Freq: Four times a day (QID) | ORAL | Status: DC

## 2013-04-04 MED ORDER — INDIGOTINDISULFONATE SODIUM 8 MG/ML IJ SOLN
INTRAMUSCULAR | Status: AC
  Filled 2013-04-04: qty 5

## 2013-04-04 SURGICAL SUPPLY — 20 items
BAG URINE DRAINAGE (UROLOGICAL SUPPLIES) IMPLANT
BASKET LASER NITINOL 1.9FR (BASKET) IMPLANT
BASKET STNLS GEMINI 4WIRE 3FR (BASKET) IMPLANT
BASKET ZERO TIP NITINOL 2.4FR (BASKET) IMPLANT
CATH INTERMIT  6FR 70CM (CATHETERS) ×2 IMPLANT
DRAPE CAMERA CLOSED 9X96 (DRAPES) ×2 IMPLANT
ELECT REM PT RETURN 9FT ADLT (ELECTROSURGICAL)
ELECTRODE REM PT RTRN 9FT ADLT (ELECTROSURGICAL) IMPLANT
GLOVE BIOGEL M STRL SZ7.5 (GLOVE) ×2 IMPLANT
GOWN PREVENTION PLUS LG XLONG (DISPOSABLE) ×2 IMPLANT
GUIDEWIRE ANG ZIPWIRE 038X150 (WIRE) ×2 IMPLANT
GUIDEWIRE STR DUAL SENSOR (WIRE) ×2 IMPLANT
IV NS IRRIG 3000ML ARTHROMATIC (IV SOLUTION) ×2 IMPLANT
LASER FIBER DISP (UROLOGICAL SUPPLIES) IMPLANT
PACK CYSTO (CUSTOM PROCEDURE TRAY) ×2 IMPLANT
SHEATH ACCESS URETERAL 38CM (SHEATH) ×2 IMPLANT
STENT CONTOUR 6FRX26X.038 (STENTS) ×2 IMPLANT
SYRINGE 10CC LL (SYRINGE) IMPLANT
SYRINGE IRR TOOMEY STRL 70CC (SYRINGE) IMPLANT
TUBE FEEDING 8FR 16IN STR KANG (MISCELLANEOUS) ×2 IMPLANT

## 2013-04-04 NOTE — H&P (Signed)
[name] [name] is an 67 y.o. male.   Chief Complaint: Pre-op Rt ureteroscopic Stone Manipulation  1 - Rt Ureteral Stone + Acute Renal Failure - 7mm rt prox stone with severe nausea / emesis. Cr 2. Unable to maintain hydration. No fevers. UA without infection   Past Medical History  Diagnosis Date  . Kidney stones   . Kidney stone   . Arthritis     Past Surgical History  Procedure Laterality Date  . Total knee arthroplasty Right 09/2012  . Cholecystectomy      History reviewed. No pertinent family history. Social History:  reports that he has quit smoking. He does not have any smokeless tobacco history on file. He reports that he drinks about 1.2 ounces of alcohol per week. He reports that he does not use illicit drugs.  Allergies: No Known Allergies  Medications Prior to Admission  Medication Sig Dispense Refill  . acetaminophen (TYLENOL) 500 MG tablet Take 1,000 mg by mouth every 6 (six) hours as needed for pain.      . meloxicam (MOBIC) 7.5 MG tablet Take 7.5 mg by mouth every morning.       . ondansetron (ZOFRAN) 4 MG tablet Take 1 tablet (4 mg total) by mouth every 6 (six) hours.  12 tablet  0  . oxyCODONE-acetaminophen (PERCOCET) 7.5-325 MG per tablet Take 1 tablet by mouth every 6 (six) hours as needed for pain.      . promethazine (PHENERGAN) 25 MG tablet Take 25 mg by mouth every 6 (six) hours as needed for nausea.      . traMADol (ULTRAM) 50 MG tablet Take 50 mg by mouth every 6 (six) hours as needed for pain.        Results for orders placed during the hospital encounter of 04/03/13 (from the past 48 hour(s))  URINALYSIS, ROUTINE W REFLEX MICROSCOPIC     Status: None   Collection Time    04/03/13  6:47 AM      Result Value Range   Color, Urine YELLOW  YELLOW   APPearance CLEAR  CLEAR   Specific Gravity, Urine 1.016  1.005 - 1.030   pH 6.0  5.0 - 8.0   Glucose, UA NEGATIVE  NEGATIVE mg/dL   Hgb urine dipstick NEGATIVE  NEGATIVE   Bilirubin Urine NEGATIVE  NEGATIVE    Ketones, ur NEGATIVE  NEGATIVE mg/dL   Protein, ur NEGATIVE  NEGATIVE mg/dL   Urobilinogen, UA 0.2  0.0 - 1.0 mg/dL   Nitrite NEGATIVE  NEGATIVE   Leukocytes, UA NEGATIVE  NEGATIVE   Comment: MICROSCOPIC NOT DONE ON URINES WITH NEGATIVE PROTEIN, BLOOD, LEUKOCYTES, NITRITE, OR GLUCOSE <1000 mg/dL.  CBC WITH DIFFERENTIAL     Status: Abnormal   Collection Time    04/03/13  7:10 AM      Result Value Range   WBC 10.4  4.0 - 10.5 K/uL   RBC 5.09  4.22 - 5.81 MIL/uL   Hemoglobin 15.3  13.0 - 17.0 g/dL   HCT 16.1  09.6 - 04.5 %   MCV 85.9  78.0 - 100.0 fL   MCH 30.1  26.0 - 34.0 pg   MCHC 35.0  30.0 - 36.0 g/dL   RDW 40.9  81.1 - 91.4 %   Platelets 157  150 - 400 K/uL   Neutrophils Relative % 80 (*) 43 - 77 %   Neutro Abs 8.3 (*) 1.7 - 7.7 K/uL   Lymphocytes Relative 11 (*) 12 - 46 %   Lymphs  Abs 1.1  0.7 - 4.0 K/uL   Monocytes Relative 9  3 - 12 %   Monocytes Absolute 0.9  0.1 - 1.0 K/uL   Eosinophils Relative 1  0 - 5 %   Eosinophils Absolute 0.1  0.0 - 0.7 K/uL   Basophils Relative 0  0 - 1 %   Basophils Absolute 0.0  0.0 - 0.1 K/uL  COMPREHENSIVE METABOLIC PANEL     Status: Abnormal   Collection Time    04/03/13  7:10 AM      Result Value Range   Sodium 136  135 - 145 mEq/L   Potassium 4.5  3.5 - 5.1 mEq/L   Chloride 99  96 - 112 mEq/L   CO2 27  19 - 32 mEq/L   Glucose, Bld 136 (*) 70 - 99 mg/dL   BUN 28 (*) 6 - 23 mg/dL   Creatinine, Ser 4.09 (*) 0.50 - 1.35 mg/dL   Calcium 9.4  8.4 - 81.1 mg/dL   Total Protein 7.4  6.0 - 8.3 g/dL   Albumin 4.4  3.5 - 5.2 g/dL   AST 24  0 - 37 U/L   ALT 26  0 - 53 U/L   Alkaline Phosphatase 59  39 - 117 U/L   Total Bilirubin 0.7  0.3 - 1.2 mg/dL   GFR calc non Af Amer 34 (*) >90 mL/min   GFR calc Af Amer 39 (*) >90 mL/min   Comment:            The eGFR has been calculated     using the CKD EPI equation.     This calculation has not been     validated in all clinical     situations.     eGFR's persistently     <90 mL/min signify      possible Chronic Kidney Disease.  LIPASE, BLOOD     Status: None   Collection Time    04/03/13  7:10 AM      Result Value Range   Lipase 21  11 - 59 U/L   Ct Abdomen Pelvis Wo Contrast  04/03/2013   *RADIOLOGY REPORT*  Clinical Data: Right flank pain since Monday, history of kidney stones  CT ABDOMEN AND PELVIS WITHOUT CONTRAST  Technique:  Multidetector CT imaging of the abdomen and pelvis was performed following the standard protocol without intravenous contrast.  Comparison: None.  Findings: The lung bases are clear.  The liver is unremarkable in the unenhanced state.  Surgical clips are present from prior cholecystectomy.  The pancreas is normal in size and the pancreatic duct is not dilated.  The adrenal glands and spleen are unremarkable.  The stomach is not well distended but no abnormality is evident.  There is a moderate right hydronephrosis present caused by a 5 x 6 x 7 mm proximal right ureteral calculus.  Only a tiny 2 mm calculus is noted in the lower pole of the right kidney as well.  No left renal calculi are seen.  The abdominal aorta is normal in caliber. No adenopathy is noted.  The more distal ureters are normal in caliber and no distal ureteral calculus is noted.  The urinary bladder is moderately urine distended with no abnormality evident.  The prostate is slightly prominent measuring 5.1 x 5.4 cm.  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  The terminal ileum is unremarkable.  The appendix is better seen on the  coronal images, filling with air.  No inflammatory process is seen.  There are degenerative changes throughout the facet joints of the lower lumbar spine.  IMPRESSION:  1.  Moderate right hydronephrosis caused by a 5 x 6 x 7 mm proximal right ureteral calculus. 2.  2 mm nonobstructing calculus in the lower pole of the right kidney.  No left renal calculi are seen. 3.  Multiple rectosigmoid  and descending colon diverticula. 4.  Prominent prostate.   Original Report Authenticated By: Dwyane Dee, M.D.    Review of Systems  Constitutional: Negative.  Negative for fever and chills.  HENT: Negative.   Eyes: Negative.   Respiratory: Negative.   Cardiovascular: Negative.   Gastrointestinal: Positive for nausea, vomiting and abdominal pain.  Genitourinary: Positive for flank pain. Negative for dysuria.  Skin: Negative.   Neurological: Negative.   Endo/Heme/Allergies: Negative.   Psychiatric/Behavioral: Negative.     Blood pressure 167/79, pulse 59, temperature 98.2 F (36.8 C), temperature source Oral, resp. rate 18, height 6\' 4"  (1.93 m), weight 120.203 kg (265 lb), SpO2 100.00%. Physical Exam  Constitutional: He is oriented to person, place, and time. He appears well-developed and well-nourished.  HENT:  Head: Normocephalic and atraumatic.  Eyes: EOM are normal. Pupils are equal, round, and reactive to light.  Neck: Normal range of motion. Neck supple.  Cardiovascular: Normal rate and regular rhythm.   Respiratory: Effort normal and breath sounds normal.  GI: Soft. Bowel sounds are normal.  Genitourinary: Penis normal.  Rt CVAT  Musculoskeletal: Normal range of motion.  Neurological: He is alert and oriented to person, place, and time.  Skin: Skin is warm and dry.  Psychiatric: He has a normal mood and affect. His behavior is normal. Judgment and thought content normal.     Assessment/Plan 1 - Rt Ureteral Stone + Acute Renal Failure - Proceed with Rt ureteroscopc stone manipulation as discussed today in office. Will stetn if anatomy unfavorable for staged approach.  Tashunda Vandezande 04/04/2013, 5:44 PM

## 2013-04-04 NOTE — Op Note (Signed)
NAMEJHASE, CREPPEL NO.:  [id number]  MEDICAL RECORD NO.:  [id number]  LOCATION:  [facility]                         FACILITY:  [facility]  PHYSICIAN:  Sebastian Ache, MD     DATE OF BIRTH:  04-17-1946  DATE OF PROCEDURE: 04/04/2013 DATE OF DISCHARGE:                              OPERATIVE REPORT   DIAGNOSIS:  Right proximal ureteral stone, refractory nausea, vomiting, acute renal failure.  PROCEDURE: 1. Cystoscopy with right retrograde pyelogram interpretation. 2. Right diagnostic ureteroscopy. 3. Insertion of right ureteral stent 6 x 26, no tether.  BLOOD LOSS:  Nil.  SPECIMENS:  None.  COMPLICATIONS:  None.  FINDINGS: 1. Unremarkable urinary bladder. 2. Proximal ureteral filling defect consistent with known stone with     mild proximal hydronephrosis. 3. Narrow caliber ureter proximal to the iliac vessels.  This did not     allow accessible stone manipulation today.  INDICATION:  [name] [name] is a pleasant 67 year old gentleman with recent onset of acute right renal colic from a 6-mm proximal right ureteral stone.  He was seen in the ER yesterday for initial trial of medical therapy.  He re-presented urgently to our office today with acute renal failure with creatinine of 2, and refractory nausea and vomiting, unable to keep any solids or liquids down.  Options were discussed including percutaneous nephrostomy versus ureteral stenting versus a trial of definitive stone management.  He wished to proceed with the latter with ureteroscopic stone manipulation.  Informed consent was obtained and placed in the medical record.  PROCEDURE IN DETAIL:  The patient being [name] [name] is verified. Procedure being right ureteroscopic stone manipulation was confirmed. Procedure was carried out.  Time-out was performed.  Intravenous antibiotics administered.  General endotracheal anesthesia was introduced.  The patient was placed into a low lithotomy position. Sterile  field was created by prepping and draping the patient's penis, perineum, and proximal thighs using iodine x3.  Next, cystourethroscopy was performed using a 22-French rigid cystoscope with 12-degree offset lens.  Inspection of the anterior and posterior urethra revealed only mild trilobar prostatic hypertrophy.  Inspection of bladder revealed no diverticula, calcifications, papular lesions.  Ureteral orifices were in the normal anatomic position bilaterally.  The right ureteral orifice was cannulated with a 6-French end-hole catheter and right retrograde pyelogram was obtained.  Right retrograde pyelogram demonstrated a single right ureter, single system, right kidney.  There was a filling defect in proximal ureter consistent with known stone with very mild proximal hydronephrosis.  A 0.038 Glidewire was advanced at the level of the upper pole and set aside as a safety wire.  Next, an 8-French feeding tube was introduced to the urinary bladder for pressure release, and semi-rigid ureteroscopy was performed of the distal half of the ureter alongside a separate Sensor working wire.  This revealed no mucosal abnormalities.  The ureter did reveal significant narrow caliber especially above the area of the iliac crossing.  The caliber was felt that initial attempt that the sheath passage was warranted.  As such, the semi-rigid ureteroscope was exchanged for a 12/14 38-cm ureteral access sheath, which was carefully navigated under continuous fluoroscopic vision.  The sheath was seen to easily passed  through the area of the intramural ureter but then the sheath did not easily pass above the area of the iliac crossing.  This was previously noted to be relatively narrow by direct visualization.  It was felt that the safest way to proceed would be the stent to allow for passive ureteral dilation, and proceed with a staged procedure.  This had been discussed.  Appears that the patient has  a distinct possibility.  As such, a new 6 x 26 double-J stent was placed using cystoscopic and fluoroscopic guidance over the remaining safety wire with good proximal distal curl were noted.  Efflux of urine was seen around and through the distal end of the stent.  Bladder was emptied per cystoscope.  Procedure was then terminated.  The patient tolerated the procedure well.  There were no immediate periprocedural complications.  The patient was taken to the Postanesthesia Care Unit in stable condition.          ______________________________ Sebastian Ache, MD     TM/MEDQ  D:  04/04/2013  T:  04/04/2013  Job:  [id number]

## 2013-04-04 NOTE — Anesthesia Preprocedure Evaluation (Addendum)
Anesthesia Evaluation  Patient identified by MRN, date of birth, ID band Patient awake    Reviewed: Allergy & Precautions, H&P , NPO status , Patient's Chart, lab work & pertinent test results  Airway Mallampati: II TM Distance: >3 FB Neck ROM: Full    Dental no notable dental hx.    Pulmonary former smoker,  breath sounds clear to auscultation  Pulmonary exam normal       Cardiovascular Exercise Tolerance: Good negative cardio ROS  Rhythm:Regular Rate:Normal     Neuro/Psych negative neurological ROS  negative psych ROS   GI/Hepatic negative GI ROS, Neg liver ROS,   Endo/Other  negative endocrine ROS  Renal/GU Renal InsufficiencyRenal diseaseCr 1.99 K 4.5  negative genitourinary   Musculoskeletal negative musculoskeletal ROS (+)   Abdominal (+) + obese,   Peds negative pediatric ROS (+)  Hematology negative hematology ROS (+)   Anesthesia Other Findings   Reproductive/Obstetrics negative OB ROS                           Anesthesia Physical Anesthesia Plan  ASA: II  Anesthesia Plan: General   Post-op Pain Management:    Induction: Intravenous  Airway Management Planned: LMA  Additional Equipment:   Intra-op Plan:   Post-operative Plan: Extubation in OR  Informed Consent: I have reviewed the patients History and Physical, chart, labs and discussed the procedure including the risks, benefits and alternatives for the proposed anesthesia with the patient or authorized representative who has indicated his/her understanding and acceptance.   Dental advisory given  Plan Discussed with: CRNA  Anesthesia Plan Comments: (Banana at 0900 which he has vomited.)       Anesthesia Quick Evaluation

## 2013-04-04 NOTE — Brief Op Note (Signed)
04/04/2013  6:24 PM  PATIENT:  [name] [name]  67 y.o. male  PRE-OPERATIVE DIAGNOSIS:  right kidney stone  POST-OPERATIVE DIAGNOSIS:  right kidney stone  PROCEDURE:  Procedure(s): CYSTOSCOPY WITH RIGHT RETROGRADE PYELOGRAM, AND RIGHT STENT PLACEMENT, DIAGNOSTIC RIGHT URETEROSCOPY (Right)  SURGEON:  Surgeon(s) and Role:    * Sebastian Ache, MD - Primary  PHYSICIAN ASSISTANT:   ASSISTANTS: none   ANESTHESIA:   general  EBL:  Total I/O In: -  Out: 150 [Urine:150]  BLOOD ADMINISTERED:none  DRAINS: none   LOCAL MEDICATIONS USED:  NONE  SPECIMEN:  No Specimen  DISPOSITION OF SPECIMEN:  N/A  COUNTS:  YES  TOURNIQUET:  * No tourniquets in log *  DICTATION: .Other Dictation: Dictation Number [id number]  PLAN OF CARE: Discharge to home after PACU  PATIENT DISPOSITION:  PACU - hemodynamically stable.   Delay start of Pharmacological VTE agent (>24hrs) due to surgical blood loss or risk of bleeding: not applicable

## 2013-04-04 NOTE — Transfer of Care (Signed)
Immediate Anesthesia Transfer of Care Note  Patient: [name] [name]  Procedure(s) Performed: Procedure(s): CYSTOSCOPY WITH RIGHT RETROGRADE PYELOGRAM, AND RIGHT STENT PLACEMENT, DIAGNOSTIC RIGHT URETEROSCOPY (Right)  Patient Location: PACU  Anesthesia Type:General  Level of Consciousness: sedated  Airway & Oxygen Therapy: Patient Spontanous Breathing and Patient connected to face mask oxygen  Post-op Assessment: Report given to PACU RN and Post -op Vital signs reviewed and stable  Post vital signs: stable  Complications: No apparent anesthesia complications

## 2013-04-04 NOTE — Anesthesia Postprocedure Evaluation (Signed)
  Anesthesia Post-op Note  Patient: [name] [name]  Procedure(s) Performed: Procedure(s) (LRB): CYSTOSCOPY WITH RIGHT RETROGRADE PYELOGRAM, AND RIGHT STENT PLACEMENT, DIAGNOSTIC RIGHT URETEROSCOPY (Right)  Patient Location: PACU  Anesthesia Type: General  Level of Consciousness: awake and alert   Airway and Oxygen Therapy: Patient Spontanous Breathing  Post-op Pain: mild  Post-op Assessment: Post-op Vital signs reviewed, Patient's Cardiovascular Status Stable, Respiratory Function Stable, Patent Airway and No signs of Nausea or vomiting  Last Vitals:  Filed Vitals:   04/04/13 1930  BP: 144/77  Pulse: 61  Temp: 36.4 C  Resp: 13    Post-op Vital Signs: stable   Complications: No apparent anesthesia complications

## 2013-04-07 ENCOUNTER — Encounter (HOSPITAL_COMMUNITY): Payer: Self-pay | Admitting: Urology

## 2013-04-07 ENCOUNTER — Other Ambulatory Visit: Payer: Self-pay | Admitting: Urology

## 2013-04-29 ENCOUNTER — Encounter (HOSPITAL_BASED_OUTPATIENT_CLINIC_OR_DEPARTMENT_OTHER): Payer: Self-pay | Admitting: *Deleted

## 2013-05-01 ENCOUNTER — Encounter (HOSPITAL_BASED_OUTPATIENT_CLINIC_OR_DEPARTMENT_OTHER): Payer: Self-pay | Admitting: *Deleted

## 2013-05-01 NOTE — Progress Notes (Signed)
NPO AFTER MN. ARRIVES AT 0700. MAY TAKE TRAMADOL IF NEEDED W/ SIP OF WATER AM OF SURG .

## 2013-05-06 NOTE — Anesthesia Preprocedure Evaluation (Addendum)
Anesthesia Evaluation  Patient identified by MRN, date of birth, ID band Patient awake    Reviewed: Allergy & Precautions, H&P , NPO status , Patient's Chart, lab work & pertinent test results  Airway Mallampati: II TM Distance: >3 FB Neck ROM: full    Dental no notable dental hx. (+) Teeth Intact and Dental Advisory Given   Pulmonary neg pulmonary ROS,  breath sounds clear to auscultation  Pulmonary exam normal       Cardiovascular Exercise Tolerance: Good negative cardio ROS  Rhythm:regular Rate:Normal     Neuro/Psych negative neurological ROS  negative psych ROS   GI/Hepatic negative GI ROS, Neg liver ROS,   Endo/Other  negative endocrine ROS  Renal/GU negative Renal ROS  negative genitourinary   Musculoskeletal   Abdominal   Peds  Hematology negative hematology ROS (+)   Anesthesia Other Findings   Reproductive/Obstetrics negative OB ROS                           Anesthesia Physical Anesthesia Plan  ASA: I  Anesthesia Plan: General   Post-op Pain Management:    Induction: Intravenous  Airway Management Planned: LMA  Additional Equipment:   Intra-op Plan:   Post-operative Plan:   Informed Consent: I have reviewed the patients History and Physical, chart, labs and discussed the procedure including the risks, benefits and alternatives for the proposed anesthesia with the patient or authorized representative who has indicated his/her understanding and acceptance.   Dental Advisory Given  Plan Discussed with: CRNA and Surgeon  Anesthesia Plan Comments:         Anesthesia Quick Evaluation  

## 2013-05-07 ENCOUNTER — Ambulatory Visit (HOSPITAL_COMMUNITY): Payer: BC Managed Care – PPO

## 2013-05-07 ENCOUNTER — Encounter (HOSPITAL_BASED_OUTPATIENT_CLINIC_OR_DEPARTMENT_OTHER)
Admission: RE | Disposition: A | Discharge status: Home / Self Care | Payer: Self-pay | Source: Ambulatory Visit | Attending: Urology

## 2013-05-07 ENCOUNTER — Encounter (HOSPITAL_BASED_OUTPATIENT_CLINIC_OR_DEPARTMENT_OTHER): Payer: Self-pay | Admitting: Anesthesiology

## 2013-05-07 ENCOUNTER — Encounter (HOSPITAL_BASED_OUTPATIENT_CLINIC_OR_DEPARTMENT_OTHER): Payer: Self-pay | Admitting: *Deleted

## 2013-05-07 ENCOUNTER — Ambulatory Visit (HOSPITAL_BASED_OUTPATIENT_CLINIC_OR_DEPARTMENT_OTHER): Payer: BC Managed Care – PPO | Admitting: Anesthesiology

## 2013-05-07 ENCOUNTER — Ambulatory Visit (HOSPITAL_BASED_OUTPATIENT_CLINIC_OR_DEPARTMENT_OTHER)
Admission: RE | Admit: 2013-05-07 | Discharge: 2013-05-07 | Disposition: A | Discharge status: Home / Self Care | Payer: BC Managed Care – PPO | Source: Ambulatory Visit | Attending: Urology | Admitting: Urology

## 2013-05-07 DIAGNOSIS — N179 Acute kidney failure, unspecified: Secondary | ICD-10-CM | POA: Insufficient documentation

## 2013-05-07 DIAGNOSIS — N200 Calculus of kidney: Secondary | ICD-10-CM | POA: Insufficient documentation

## 2013-05-07 DIAGNOSIS — Z87891 Personal history of nicotine dependence: Secondary | ICD-10-CM | POA: Insufficient documentation

## 2013-05-07 HISTORY — DX: Hematuria, unspecified: R31.9

## 2013-05-07 HISTORY — PX: HOLMIUM LASER APPLICATION: SHX5852

## 2013-05-07 HISTORY — PX: CYSTOSCOPY WITH URETEROSCOPY AND STENT PLACEMENT: SHX6377

## 2013-05-07 HISTORY — DX: Calculus of ureter: N20.1

## 2013-05-07 HISTORY — DX: Urgency of urination: R39.15

## 2013-05-07 HISTORY — DX: Frequency of micturition: R35.0

## 2013-05-07 LAB — POCT HEMOGLOBIN-HEMACUE: Hemoglobin: 15 g/dL (ref 13.0–17.0)

## 2013-05-07 IMAGING — RF DG ABDOMEN 1V
1 series · 2 of 2 positions shown · non-contrast
Comparison: None.

CLINICAL DATA: Right-sided renal stone removal and stent placement.

ABDOMEN - 1 VIEW

[Series 1: run · 2 of 2 slices shown]
[im 1/2]
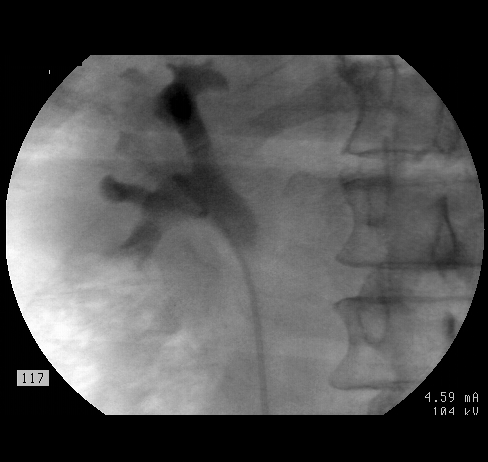
[im 2/2]
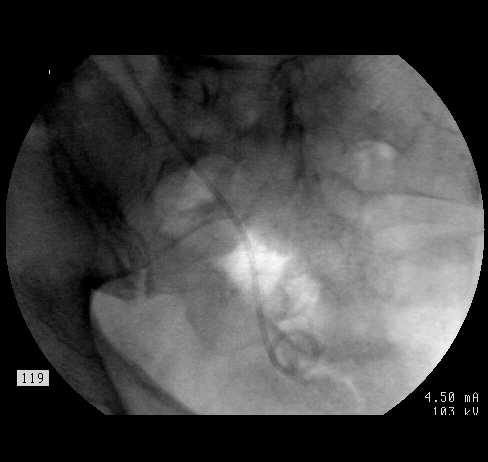

[2 of 2 positions shown; findings below may reference images not displayed]

FINDINGS: Intraoperative images show contrast in the right renal
collecting system and a ureteral stent extending from the proximal
renal pelvis into the bladder.
IMPRESSION: Intraoperative imaging during urologic procedure demonstrating
placement of a right ureteral stent which appears in appropriate
position.

## 2013-05-07 SURGERY — CYSTOURETEROSCOPY, WITH STENT INSERTION
Anesthesia: General | Site: Ureter | Laterality: Right | Wound class: Clean Contaminated

## 2013-05-07 MED ORDER — CEFAZOLIN SODIUM 1-5 GM-% IV SOLN
1.0000 g | INTRAVENOUS | Status: DC
  Filled 2013-05-07: qty 50

## 2013-05-07 MED ORDER — DEXAMETHASONE SODIUM PHOSPHATE 4 MG/ML IJ SOLN
INTRAMUSCULAR | Status: DC | PRN
  Administered 2013-05-07: 10 mg via INTRAVENOUS

## 2013-05-07 MED ORDER — LACTATED RINGERS IV SOLN
INTRAVENOUS | Status: DC
  Filled 2013-05-07: qty 1000

## 2013-05-07 MED ORDER — IOHEXOL 350 MG/ML SOLN
INTRAVENOUS | Status: DC | PRN
  Administered 2013-05-07: 22 mL

## 2013-05-07 MED ORDER — MIDAZOLAM HCL 5 MG/5ML IJ SOLN
INTRAMUSCULAR | Status: DC | PRN
  Administered 2013-05-07: 2 mg via INTRAVENOUS

## 2013-05-07 MED ORDER — DEXTROSE 5 % IV SOLN
3.0000 g | INTRAVENOUS | Status: AC
  Administered 2013-05-07: 3 g via INTRAVENOUS
  Filled 2013-05-07: qty 3000

## 2013-05-07 MED ORDER — SODIUM CHLORIDE 0.9 % IR SOLN
Status: DC | PRN
  Administered 2013-05-07: 6000 mL

## 2013-05-07 MED ORDER — ONDANSETRON HCL 4 MG/2ML IJ SOLN
INTRAMUSCULAR | Status: DC | PRN
  Administered 2013-05-07: 4 mg via INTRAVENOUS

## 2013-05-07 MED ORDER — PROPOFOL 10 MG/ML IV BOLUS
INTRAVENOUS | Status: DC | PRN
  Administered 2013-05-07: 200 mg via INTRAVENOUS

## 2013-05-07 MED ORDER — CEPHALEXIN 250 MG PO CAPS: 250.0000 mg | ORAL_CAPSULE | Freq: Two times a day (BID) | ORAL | Status: DC

## 2013-05-07 MED ORDER — TRAMADOL HCL 50 MG PO TABS: 50.0000 mg | ORAL_TABLET | Freq: Four times a day (QID) | ORAL | Status: DC | PRN

## 2013-05-07 MED ORDER — KETOROLAC TROMETHAMINE 30 MG/ML IJ SOLN
INTRAMUSCULAR | Status: DC | PRN
  Administered 2013-05-07: 30 mg via INTRAVENOUS

## 2013-05-07 MED ORDER — SENNOSIDES-DOCUSATE SODIUM 8.6-50 MG PO TABS: 1.0000 | ORAL_TABLET | Freq: Two times a day (BID) | ORAL | Status: DC

## 2013-05-07 MED ORDER — STERILE WATER FOR IRRIGATION IR SOLN
Status: DC | PRN
  Administered 2013-05-07: 1

## 2013-05-07 MED ORDER — FENTANYL CITRATE 0.05 MG/ML IJ SOLN
INTRAMUSCULAR | Status: DC | PRN
  Administered 2013-05-07: 25 ug via INTRAVENOUS
  Administered 2013-05-07 (×2): 50 ug via INTRAVENOUS
  Administered 2013-05-07 (×3): 25 ug via INTRAVENOUS

## 2013-05-07 MED ORDER — TRAMADOL HCL 50 MG PO TABS
50.0000 mg | ORAL_TABLET | Freq: Four times a day (QID) | ORAL | Status: DC | PRN
  Administered 2013-05-07: 50 mg via ORAL
  Filled 2013-05-07: qty 1

## 2013-05-07 MED ORDER — FENTANYL CITRATE 0.05 MG/ML IJ SOLN
25.0000 ug | INTRAMUSCULAR | Status: DC | PRN
  Filled 2013-05-07: qty 1

## 2013-05-07 MED ORDER — LIDOCAINE HCL (CARDIAC) 20 MG/ML IV SOLN
INTRAVENOUS | Status: DC | PRN
  Administered 2013-05-07: 100 mg via INTRAVENOUS

## 2013-05-07 MED ORDER — URELLE 81 MG PO TABS
1.0000 | ORAL_TABLET | Freq: Four times a day (QID) | ORAL | Status: DC
  Administered 2013-05-07: 81 mg via ORAL
  Filled 2013-05-07: qty 1

## 2013-05-07 MED ORDER — LACTATED RINGERS IV SOLN
INTRAVENOUS | Status: DC
  Administered 2013-05-07 (×2): via INTRAVENOUS
  Filled 2013-05-07: qty 1000

## 2013-05-07 SURGICAL SUPPLY — 42 items
ADAPTER CATH URET PLST 4-6FR (CATHETERS) IMPLANT
BAG DRAIN URO-CYSTO SKYTR STRL (DRAIN) ×2 IMPLANT
BAG URO CATCHER STRL LF (DRAPE) ×2 IMPLANT
BASKET LASER NITINOL 1.9FR (BASKET) ×2 IMPLANT
BASKET STNLS GEMINI 4WIRE 3FR (BASKET) IMPLANT
BASKET ZERO TIP NITINOL 2.4FR (BASKET) ×2 IMPLANT
BRUSH URET BIOPSY 3F (UROLOGICAL SUPPLIES) IMPLANT
CANISTER SUCT LVC 12 LTR MEDI- (MISCELLANEOUS) ×2 IMPLANT
CATH FOLEY 2WAY  3CC  8FR (CATHETERS)
CATH FOLEY 2WAY 3CC 8FR (CATHETERS) IMPLANT
CATH INTERMIT  6FR 70CM (CATHETERS) IMPLANT
CATH URET 5FR 28IN CONE TIP (BALLOONS)
CATH URET 5FR 28IN OPEN ENDED (CATHETERS) ×2 IMPLANT
CATH URET 5FR 70CM CONE TIP (BALLOONS) IMPLANT
CLOTH BEACON ORANGE TIMEOUT ST (SAFETY) ×2 IMPLANT
DRAPE CAMERA CLOSED 9X96 (DRAPES) ×2 IMPLANT
ELECT REM PT RETURN 9FT ADLT (ELECTROSURGICAL)
ELECTRODE REM PT RTRN 9FT ADLT (ELECTROSURGICAL) IMPLANT
FIBER LASER TRAC TIP (UROLOGICAL SUPPLIES) IMPLANT
GLOVE BIO SURGEON STRL SZ7 (GLOVE) ×2 IMPLANT
GLOVE BIO SURGEON STRL SZ7.5 (GLOVE) ×2 IMPLANT
GOWN PREVENTION PLUS LG XLONG (DISPOSABLE) IMPLANT
GOWN PREVENTION PLUS XLARGE (GOWN DISPOSABLE) ×2 IMPLANT
GOWN STRL NON-REIN LRG LVL3 (GOWN DISPOSABLE) ×2 IMPLANT
GOWN STRL REIN XL XLG (GOWN DISPOSABLE) IMPLANT
GUIDEWIRE 0.038 PTFE COATED (WIRE) IMPLANT
GUIDEWIRE ANG ZIPWIRE 038X150 (WIRE) ×2 IMPLANT
GUIDEWIRE STR DUAL SENSOR (WIRE) ×2 IMPLANT
IV NS IRRIG 3000ML ARTHROMATIC (IV SOLUTION) ×4 IMPLANT
KIT BALLIN UROMAX 15FX10 (LABEL) IMPLANT
KIT BALLN UROMAX 15FX4 (MISCELLANEOUS) IMPLANT
KIT BALLN UROMAX 26 75X4 (MISCELLANEOUS)
LASER FIBER DISP (UROLOGICAL SUPPLIES) ×2 IMPLANT
PACK CYSTOSCOPY (CUSTOM PROCEDURE TRAY) ×2 IMPLANT
SET HIGH PRES BAL DIL (LABEL)
SHEATH ACCESS URETERAL 38CM (SHEATH) ×2 IMPLANT
SHEATH URET ACCESS 12FR/35CM (UROLOGICAL SUPPLIES) IMPLANT
SHEATH URET ACCESS 12FR/55CM (UROLOGICAL SUPPLIES) IMPLANT
STENT URET 6FRX26 CONTOUR (STENTS) ×2 IMPLANT
SYRINGE 10CC LL (SYRINGE) ×2 IMPLANT
SYRINGE IRR TOOMEY STRL 70CC (SYRINGE) IMPLANT
TUBE FEEDING 8FR 16IN STR KANG (MISCELLANEOUS) ×2 IMPLANT

## 2013-05-07 NOTE — Anesthesia Postprocedure Evaluation (Signed)
  Anesthesia Post-op Note  Patient: [name] [name]  Procedure(s) Performed: Procedure(s) (LRB): CYSTOSCOPY WITH URETEROSCOPY AND STENT PLACEMENT (Right) HOLMIUM LASER APPLICATION (Right)  Patient Location: PACU  Anesthesia Type: General  Level of Consciousness: awake and alert   Airway and Oxygen Therapy: Patient Spontanous Breathing  Post-op Pain: mild  Post-op Assessment: Post-op Vital signs reviewed, Patient's Cardiovascular Status Stable, Respiratory Function Stable, Patent Airway and No signs of Nausea or vomiting  Last Vitals:  Filed Vitals:   05/07/13 1015  BP: 128/77  Pulse: 54  Temp:   Resp: 15    Post-op Vital Signs: stable   Complications: No apparent anesthesia complications

## 2013-05-07 NOTE — H&P (Signed)
[name] [name] is an 67 y.o. male.    Chief Complaint: Pre-Op Rt Ureteroscopic Stone Manipulation  HPI:    1 - Rt Ureteral Stone, Resolted Acute Renal Failure - s/p cysto stent 7/25 for rt 6mm proximal ureteral stone, 600HU wtih refracotry nausea and emesis noted on CT from ER 7/24, no additional stones. Also Cr 1.99 without h/o prior renal insuficiency at that time. Has had prior stones x2 managed medically only. Ureteral caliber very narrow above iliacs precluded definitive managment initially.  F/u Cr 8/13 1.15.  PMH sig for gout, OA, ortho surgery, lap chole. No CV disease. No strong blood thinners.  UA from ER withotu infectious parameters. No fevers. Follow-up UCX 8/21 negative.  Today [name] [name] is seen for re-attempt Rt ureteroscopic stone manipulation. He has had minimal stent colic, just occasional hematuria worse wtih more activity. Nointerval fevers.  Past Medical History  Diagnosis Date  . Arthritis   . History of gout   . Right ureteral stone   . Frequency of urination   . Urgency of urination   . Hematuria     Past Surgical History  Procedure Laterality Date  . Cyst excision      finger  . Total knee arthroplasty  10/08/2012    Procedure: TOTAL KNEE ARTHROPLASTY;  Surgeon: Eugenia Mcalpine, MD;  Location: [facility];  Service: Orthopedics;  Laterality: Right;  . Cysto/  retrograde pyelogram/ right ureteral placement  04-04-2013  . Achilles tendon surgery Left   . Laparoscopic cholecystectomy  2003    History reviewed. No pertinent family history. Social History:  reports that he has never smoked. He has quit using smokeless tobacco. His smokeless tobacco use included Chew. He reports that  drinks alcohol. He reports that he does not use illicit drugs.  Allergies: No Known Allergies  No prescriptions prior to admission    No results found for this or any previous visit (from the past 48 hour(s)). No results found.  Review of Systems  Constitutional: Negative.   Negative for fever, chills and malaise/fatigue.  HENT: Negative.   Eyes: Negative.   Respiratory: Negative.   Cardiovascular: Negative.   Gastrointestinal: Negative.   Genitourinary: Negative.  Negative for flank pain.  Musculoskeletal: Negative.   Skin: Negative.   Neurological: Negative.   Endo/Heme/Allergies: Negative.   Psychiatric/Behavioral: Negative.     Height 6\' 4"  (1.93 m), weight 120.203 kg (265 lb). Physical Exam  Constitutional: He is oriented to person, place, and time. He appears well-developed and well-nourished.  HENT:  Head: Normocephalic and atraumatic.  Eyes: EOM are normal. Pupils are equal, round, and reactive to light.  Neck: Normal range of motion. Neck supple.  Cardiovascular: Normal rate.   Respiratory: Effort normal.  GI: Soft. Bowel sounds are normal.  Genitourinary: Penis normal.  Musculoskeletal: Normal range of motion.  Neurological: He is alert and oriented to person, place, and time.  Skin: Skin is warm and dry.  Psychiatric: He has a normal mood and affect. His behavior is normal. Judgment and thought content normal.     Assessment/Plan  1 - Rt Ureteral Stone, Acute Renal Failure - We rediscussed ureteroscopic stone manipulation with basketing and laser-lithotripsy in detail.  We rediscussed risks including bleeding, infection, damage to kidney / ureter  bladder, rarely loss of kidney. We rediscussed anesthetic risks and rare but serious surgical complications including DVT, PE, MI, and mortality. We specificallyre addressed that in 5-10% of cases a staged approach is required with stenting followed by re-attempt ureteroscopy if  anatomy unfavorable. The patient voiced understanding and wises to proceed today as planned.   [name] [name] 05/07/2013, 6:21 AM

## 2013-05-07 NOTE — Brief Op Note (Signed)
05/07/2013  9:57 AM  PATIENT:  [name] [name]  67 y.o. male  PRE-OPERATIVE DIAGNOSIS:  Right Ureteral Stone  POST-OPERATIVE DIAGNOSIS:  Right Ureteral Stone  PROCEDURE:  Procedure(s): CYSTOSCOPY WITH URETEROSCOPY AND STENT PLACEMENT (Right) HOLMIUM LASER APPLICATION (Right)  SURGEON:  Surgeon(s) and Role:    * Sebastian Ache, MD - Primary  PHYSICIAN ASSISTANT:   ASSISTANTS: none   ANESTHESIA:   general  EBL:  Total I/O In: 1000 [I.V.:1000] Out: -   BLOOD ADMINISTERED:none  DRAINS: none   LOCAL MEDICATIONS USED:  NONE  SPECIMEN:  Source of Specimen:  Rt Renal Stone  DISPOSITION OF SPECIMEN:  [facility]positional Analysis  COUNTS:  YES  TOURNIQUET:  * No tourniquets in log *  DICTATION: .Other Dictation: Dictation Number [id number]  PLAN OF CARE: Discharge to home after PACU  PATIENT DISPOSITION:  PACU - hemodynamically stable.   Delay start of Pharmacological VTE agent (>24hrs) due to surgical blood loss or risk of bleeding: not applicable

## 2013-05-07 NOTE — Op Note (Signed)
NAME:  [name], [name]              ACCOUNT NO.:  [id number]  MEDICAL RECORD NO.:  [id number]  LOCATION:                                 FACILITY:  PHYSICIAN:  Ethanael Veith, MD     DATE OF BIRTH:  12/01/1945  DATE OF PROCEDURE: 05/07/2013 DATE OF DISCHARGE:                              OPERATIVE REPORT   DIAGNOSES:  Right ureteral stone.  PROCEDURE: 1. Cystoscopy with right retrograde pyelogram and interpretation. 2. Right ureteroscopy with laser lithotripsy. 3. Exchange of right ureteral stent 6 x 26 with tether to the dorsum     of the penis.  ESTIMATED BLOOD LOSS:  Nil.  COMPLICATIONS:  None.  SPECIMEN:  Right ureteral stone for compositional analysis.  FINDINGS: 1. Unremarkable urinary bladder. 2. Persistent relative narrowing at the area of the iliac crossing     which improved.  No focal stricture in this location. 3. Displacement of previous right ureteral stone to the lower pole of     the right kidney. 4. No residual stone fragments larger than 1/3 mm at conclusion of     procedure today.  INDICATION:  [name] [name] is a pleasant 66-year-old gentleman who presented with acute renal failure and refractory renal colic.  Late last month, he underwent at that time and cystoscopy of the right ureteral stent placement as definitive management of stent was not possible at that time due to relative narrowing of the distal ureter. He therefore underwent a period of time to allow for passive dilation of the stent in place.  Now, presents for repeat endoscopic attempted management of the stone.  Notably, his creatinine had improved identifying resolution of renal failure prior to procedure today.  Urine culture was negative.  PROCEDURE IN DETAIL:  The patient being [name] [name] was verified. Procedure being right ureteroscopic stone manipulation was confirmed. Procedure was carried out.  Time-out was performed.  Intravenous antibiotics were administered.  General LMA  anesthesia was introduced. The patient was placed into a low lithotomy position.  Sterile field was created by prepping and draping the patient's penis, perineum, and proximal thighs using iodine x3.  Next, cystourethroscopy was performed using a 22-French rigid cystoscope with 12-degree offset lens. Inspection of the anterior and posterior urethra were unremarkable. Inspection of the urinary bladder revealed no diverticula, calcifications, papillary lesions.  The distal end of right ureteral stent was seen in situ.  This was grasped, brought to the level of urethral meatus through which a 0.038 Glidewire was advanced at the level of the upper pole.  The stent was exchanged for a 6-French end- hole catheter and right retrograde pyelogram was obtained.  Right retrograde pyelogram demonstrated a single right ureter, single system, right kidney.  There was a filling defect in the lower pole of the kidney system with known stone.  The Glidewire was once again advanced as a safety wire and set aside.  An 8-French feeding tube was placed in urinary bladder for pressure release.  Next, semi-rigid ureteroscopy was performed of the distal ureter alongside a separate Sensor working wire, and this revealed persistent relative narrowing at the area of the iliac crossing, however no obvious stricture.  No mucosal abnormalities   or stones were seen in the distal one-half of the ureter.  Given the relative narrowing, the semi-rigid ureteroscope was exchanged for the non digital a 6-French flexible ureteroscope as it is small in caliber.  This was passed over the working wire to the level of the kidney, endoscopic examination and then a flexible ureteroscopy revealed that indeed the previous right ureteral stone had been displaced to the lower pole the right kidney.  No additional calcifications were found.  No mucosal abnormalities of the ureter were seen.  The area of relative narrowing was carefully  inspected.  There were no focal strictures at this location.  As such, the flexible ureteroscope was exchanged for the 38 cm 12/14 ureteral access sheath over the working wire to the level of the mid ureter and flexible ureteroscopy was once again performed.  The stone was visualized and felt to be much too large for simple basketing.  As such, holmium laser Was applied the stone using settings of 0.5 joules and 5 hertz, fragmenting into approximately 4 smaller pieces.  Each one of these were very carefully removed with basketing and set aside for compositional analysis.  Repeat inspection revealed complete resolution of all stone fragments larger than 1/3 mm.  No evidence of mucosal abnormalities or perforation.  The sheath was removed under continuous ureteroscopic vision and no mucosal abnormalities were seen.  Finally, a new 6 x 26 double-IJ stent was placed over the remaining safety wire using cystoscopic and fluoroscopic guidance.  Good proximal and distal curl were noted.  Tether was left in place and fashioned to the dorsum of the penis.  Procedure was then terminated.  The patient tolerated the procedure well.  There were no immediate periprocedural complications. The patient was taken to postanesthesia care in stable condition.          ______________________________ Heela Heishman, MD     TM/MEDQ  D:  05/07/2013  T:  05/07/2013  Job:  [id number] 

## 2013-05-07 NOTE — Anesthesia Procedure Notes (Signed)
Procedure Name: LMA Insertion Date/Time: 05/07/2013 8:31 AM Performed by: Norva Pavlov Pre-anesthesia Checklist: Patient identified, Emergency Drugs available, Suction available and Patient being monitored Patient Re-evaluated:Patient Re-evaluated prior to inductionOxygen Delivery Method: Circle System Utilized Preoxygenation: Pre-oxygenation with 100% oxygen Intubation Type: IV induction Ventilation: Mask ventilation without difficulty LMA: LMA with gastric port inserted LMA Size: 5.0 Number of attempts: 1 Placement Confirmation: positive ETCO2 Tube secured with: Tape Dental Injury: Teeth and Oropharynx as per pre-operative assessment

## 2013-05-07 NOTE — Op Note (Deleted)
NAMEJOHANNES, EVERAGE NO.:  [id number]  MEDICAL RECORD NO.:  [id number]  LOCATION:                                 FACILITY:  PHYSICIAN:  Sebastian Ache, MD     DATE OF BIRTH:  25-Nov-1945  DATE OF PROCEDURE: 05/07/2013 DATE OF DISCHARGE:                              OPERATIVE REPORT   DIAGNOSES:  Right ureteral stone.  PROCEDURE: 1. Cystoscopy with right retrograde pyelogram and interpretation. 2. Right ureteroscopy with laser lithotripsy. 3. Exchange of right ureteral stent 6 x 26 with tether to the dorsum     of the penis.  ESTIMATED BLOOD LOSS:  Nil.  COMPLICATIONS:  None.  SPECIMEN:  Right ureteral stone for compositional analysis.  FINDINGS: 1. Unremarkable urinary bladder. 2. Persistent relative narrowing at the area of the iliac crossing     which improved.  No focal stricture in this location. 3. Displacement of previous right ureteral stone to the lower pole of     the right kidney. 4. No residual stone fragments larger than 1/3 mm at conclusion of     procedure today.  INDICATION:  [name] [name] is a pleasant 67 year old gentleman who presented with acute renal failure and refractory renal colic.  Late last month, he underwent at that time and cystoscopy of the right ureteral stent placement as definitive management of stent was not possible at that time due to relative narrowing of the distal ureter. He therefore underwent a period of time to allow for passive dilation of the stent in place.  Now, presents for repeat endoscopic attempted management of the stone.  Notably, his creatinine had improved identifying resolution of renal failure prior to procedure today.  Urine culture was negative.  PROCEDURE IN DETAIL:  The patient being [name] [name] was verified. Procedure being right ureteroscopic stone manipulation was confirmed. Procedure was carried out.  Time-out was performed.  Intravenous antibiotics were administered.  General LMA  anesthesia was introduced. The patient was placed into a low lithotomy position.  Sterile field was created by prepping and draping the patient's penis, perineum, and proximal thighs using iodine x3.  Next, cystourethroscopy was performed using a 22-French rigid cystoscope with 12-degree offset lens. Inspection of the anterior and posterior urethra were unremarkable. Inspection of the urinary bladder revealed no diverticula, calcifications, papillary lesions.  The distal end of right ureteral stent was seen in situ.  This was grasped, brought to the level of urethral meatus through which a 0.038 Glidewire was advanced at the level of the upper pole.  The stent was exchanged for a 6-French end- hole catheter and right retrograde pyelogram was obtained.  Right retrograde pyelogram demonstrated a single right ureter, single system, right kidney.  There was a filling defect in the lower pole of the kidney system with known stone.  The Glidewire was once again advanced as a safety wire and set aside.  An 8-French feeding tube was placed in urinary bladder for pressure release.  Next, semi-rigid ureteroscopy was performed of the distal ureter alongside a separate Sensor working wire, and this revealed persistent relative narrowing at the area of the iliac crossing, however no obvious stricture.  No mucosal abnormalities  or stones were seen in the distal one-half of the ureter.  Given the relative narrowing, the semi-rigid ureteroscope was exchanged for the non digital a 6-French flexible ureteroscope as it is small in caliber.  This was passed over the working wire to the level of the kidney, endoscopic examination and then a flexible ureteroscopy revealed that indeed the previous right ureteral stone had been displaced to the lower pole the right kidney.  No additional calcifications were found.  No mucosal abnormalities of the ureter were seen.  The area of relative narrowing was carefully  inspected.  There were no focal strictures at this location.  As such, the flexible ureteroscope was exchanged for the 38 cm 12/14 ureteral access sheath over the working wire to the level of the mid ureter and flexible ureteroscopy was once again performed.  The stone was visualized and felt to be much too large for simple basketing.  As such, holmium laser Was applied the stone using settings of 0.5 joules and 5 hertz, fragmenting into approximately 4 smaller pieces.  Each one of these were very carefully removed with basketing and set aside for compositional analysis.  Repeat inspection revealed complete resolution of all stone fragments larger than 1/3 mm.  No evidence of mucosal abnormalities or perforation.  The sheath was removed under continuous ureteroscopic vision and no mucosal abnormalities were seen.  Finally, a new 6 x 26 double-IJ stent was placed over the remaining safety wire using cystoscopic and fluoroscopic guidance.  Good proximal and distal curl were noted.  Tether was left in place and fashioned to the dorsum of the penis.  Procedure was then terminated.  The patient tolerated the procedure well.  There were no immediate periprocedural complications. The patient was taken to postanesthesia care in stable condition.          ______________________________ Sebastian Ache, MD     TM/MEDQ  D:  05/07/2013  T:  05/07/2013  Job:  [id number]

## 2013-05-07 NOTE — Transfer of Care (Signed)
Immediate Anesthesia Transfer of Care Note  Patient: [name] [name]  Procedure(s) Performed: Procedure(s) (LRB): CYSTOSCOPY WITH URETEROSCOPY AND STENT PLACEMENT (Right) HOLMIUM LASER APPLICATION (Right)  Patient Location: PACU  Anesthesia Type: General  Level of Consciousness: awake, alert  and oriented  Airway & Oxygen Therapy: Patient Spontanous Breathing and Patient connected to face mask oxygen  Post-op Assessment: Report given to PACU RN and Post -op Vital signs reviewed and stable  Post vital signs: Reviewed and stable  Complications: No apparent anesthesia complications

## 2013-05-08 ENCOUNTER — Encounter (HOSPITAL_BASED_OUTPATIENT_CLINIC_OR_DEPARTMENT_OTHER): Payer: Self-pay | Admitting: Urology

## 2016-03-21 DIAGNOSIS — M109 Gout, unspecified: Secondary | ICD-10-CM | POA: Insufficient documentation

## 2016-10-24 ENCOUNTER — Encounter (HOSPITAL_BASED_OUTPATIENT_CLINIC_OR_DEPARTMENT_OTHER): Payer: Self-pay | Admitting: Urology

## 2017-12-05 DIAGNOSIS — I10 Essential (primary) hypertension: Secondary | ICD-10-CM | POA: Insufficient documentation

## 2018-12-26 ENCOUNTER — Other Ambulatory Visit: Payer: Self-pay | Admitting: Urology

## 2018-12-26 DIAGNOSIS — C61 Malignant neoplasm of prostate: Secondary | ICD-10-CM

## 2019-02-06 ENCOUNTER — Ambulatory Visit
Admission: RE | Admit: 2019-02-06 | Discharge: 2019-02-06 | Disposition: A | Discharge status: Home / Self Care | Payer: Medicare Other | Source: Ambulatory Visit | Attending: Urology | Admitting: Urology

## 2019-02-06 ENCOUNTER — Other Ambulatory Visit: Payer: Self-pay

## 2019-02-06 DIAGNOSIS — C61 Malignant neoplasm of prostate: Secondary | ICD-10-CM

## 2019-02-06 IMAGING — MR MR PROSTATE WO/W CM
56 series · 56 of 56 positions shown · IV contrast (20 ml multihance)
Comparison: None.

CLINICAL DATA: Prostate carcinoma, Gleason score 3+4=7.

Creatinine was obtained on site at [HOSPITAL] at [HOSPITAL].
Results: Creatinine 1.3 mg/dL.
EXAM:
MR PROSTATE WITHOUT AND WITH CONTRAST
TECHNIQUE: Multiplanar multisequence MRI images were obtained of the pelvis
centered about the prostate. Pre and post contrast images were
obtained.
CONTRAST:  20mL MULTIHANCE GADOBENATE DIMEGLUMINE 529 MG/ML IV SOLN

[Series 3: T1 · axial · 8.0mm · 1.06mm/px · 1 of 28 slices shown (1 of 2)]
[im 1/28]
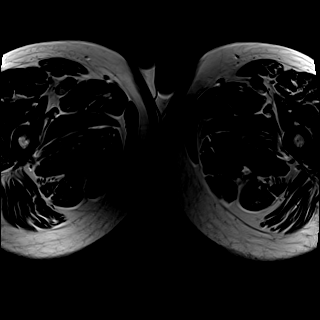

[Series 4: bSSFP fat-sat · axial · 8.0mm · 0.74mm/px · 1 of 28 slices shown]
[im 1/28]
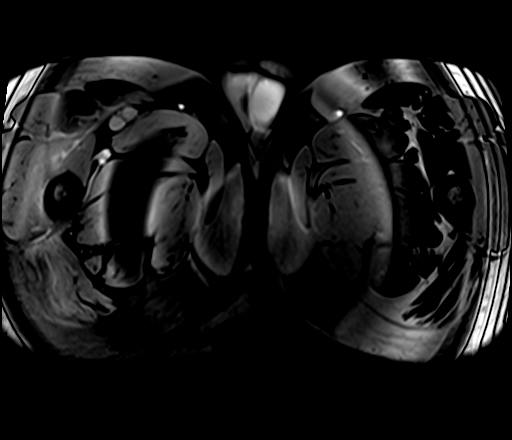

[Series 5: T2 · sagittal · 3.5mm · 0.56mm/px · 1 of 39 slices shown (1 of 4)]
[im 1/39]
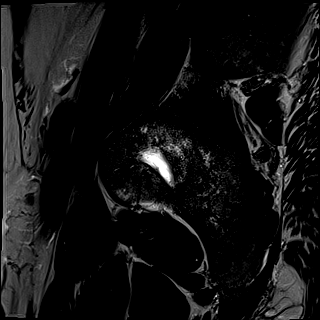

[Series 6: T1 · axial · 3.0mm · 0.31mm/px · 1 of 27 slices shown (2 of 2)]
[im 1/27]
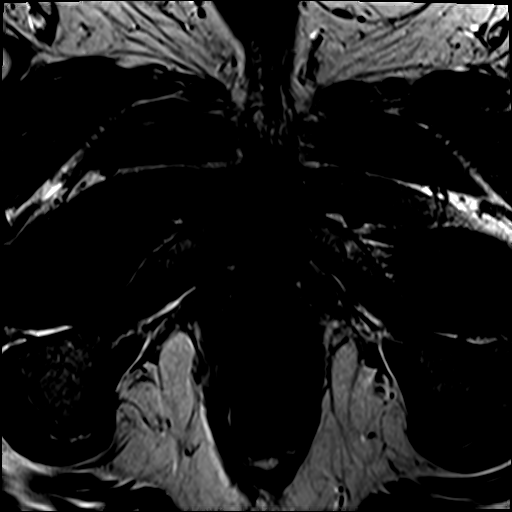

[Series 7: T2 · axial · 3.5mm · 0.56mm/px · 1 of 26 slices shown (2 of 4)]
[im 1/26]
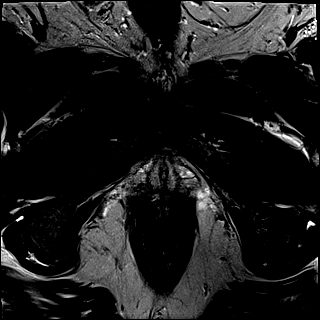

[Series 8: T2 · axial · 1.0mm · 1.04mm/px · 1 of 88 slices shown (3 of 4)]
[im 1/88]
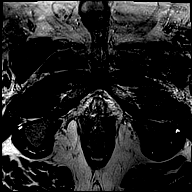

[Series 9: T2 · coronal · 3.5mm · 0.56mm/px · 1 of 23 slices shown (4 of 4)]
[im 1/23]
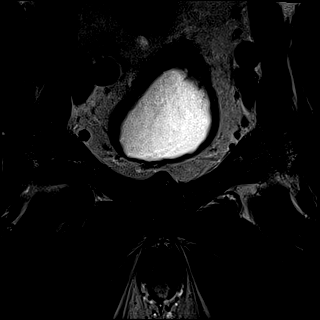

[Series 10: DWI · axial · 3.5mm · 1.56mm/px · 1 of 72 slices shown (1 of 2)]
[im 1/72]
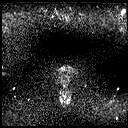

[Series 11: DWI · axial · 3.5mm · 1.56mm/px · 1 of 24 slices shown (2 of 2)]
[im 1/24]
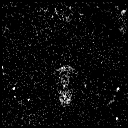

[Series 12: pre t1_twist_tra_dyn_ttc=6.4s · axial · non-contrast · 3.5mm · 0.83mm/px · 1 of 24 slices shown]
[im 1/24]
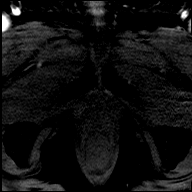

[Series 13: post t1_twist_tra_dyn-copy center · axial · 3.5mm · 0.83mm/px · 1 of 24 slices shown (1 of 24)]
[im 1/24]
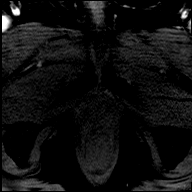

[Series 14: post t1_twist_tra_dyn-copy center · axial · 3.5mm · 0.83mm/px · 1 of 24 slices shown (2 of 24)]
[im 1/24]
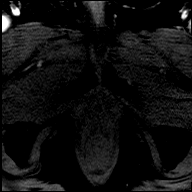

[Series 15: post t1_twist_tra_dyn-copy cent_sub_ttc=(id) · axial · 3.5mm · 0.83mm/px · 1 of 24 slices shown (1 of 22)]
[im 1/24]
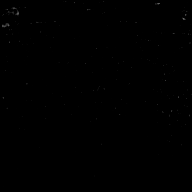

[Series 16: post t1_twist_tra_dyn-copy center · axial · 3.5mm · 0.83mm/px · 1 of 24 slices shown (3 of 24)]
[im 1/24]
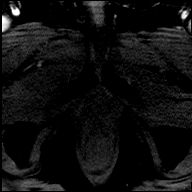

[Series 17: post t1_twist_tra_dyn-copy cent_sub_ttc=(id) · axial · 3.5mm · 0.83mm/px · 1 of 24 slices shown (2 of 22)]
[im 1/24]
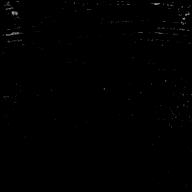

[Series 18: post t1_twist_tra_dyn-copy center · axial · 3.5mm · 0.83mm/px · 1 of 24 slices shown (4 of 24)]
[im 1/24]
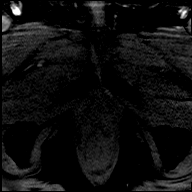

[Series 19: post t1_twist_tra_dyn-copy cent_sub_ttc=(id) · axial · 3.5mm · 0.83mm/px · 1 of 24 slices shown (3 of 22)]
[im 1/24]
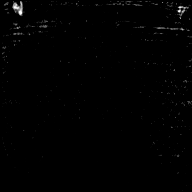

[Series 20: post t1_twist_tra_dyn-copy center · axial · 3.5mm · 0.83mm/px · 1 of 24 slices shown (5 of 24)]
[im 1/24]
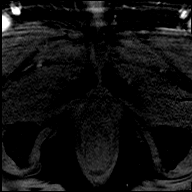

[Series 21: post t1_twist_tra_dyn-copy cent_sub_ttc=(id) · axial · 3.5mm · 0.83mm/px · 1 of 24 slices shown (4 of 22)]
[im 1/24]
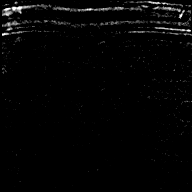

[Series 22: post t1_twist_tra_dyn-copy center · axial · 3.5mm · 0.83mm/px · 1 of 24 slices shown (6 of 24)]
[im 1/24]
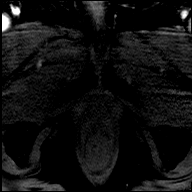

[Series 23: post t1_twist_tra_dyn-copy cent_sub_ttc=(id) · axial · 3.5mm · 0.83mm/px · 1 of 24 slices shown (5 of 22)]
[im 1/24]
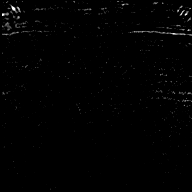

[Series 24: post t1_twist_tra_dyn-copy center · axial · 3.5mm · 0.83mm/px · 1 of 24 slices shown (7 of 24)]
[im 1/24]
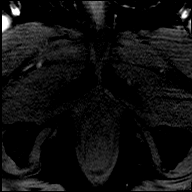

[Series 25: post t1_twist_tra_dyn-copy cent_sub_ttc=(id) · axial · 3.5mm · 0.83mm/px · 1 of 24 slices shown (6 of 22)]
[im 1/24]
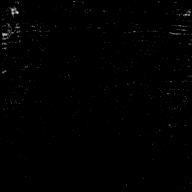

[Series 26: post t1_twist_tra_dyn-copy center · axial · 3.5mm · 0.83mm/px · 1 of 24 slices shown (8 of 24)]
[im 1/24]
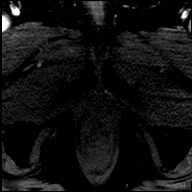

[Series 27: post t1_twist_tra_dyn-copy cent_sub_ttc=(id) · axial · 3.5mm · 0.83mm/px · 1 of 24 slices shown (7 of 22)]
[im 1/24]
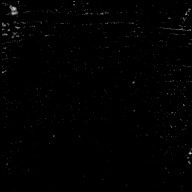

[Series 28: post t1_twist_tra_dyn-copy center · axial · 3.5mm · 0.83mm/px · 1 of 24 slices shown (9 of 24)]
[im 1/24]
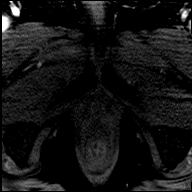

[Series 29: post t1_twist_tra_dyn-copy cent_sub_ttc=(id) · axial · 3.5mm · 0.83mm/px · 1 of 24 slices shown (8 of 22)]
[im 1/24]
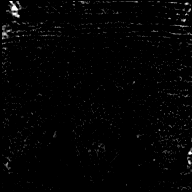

[Series 30: post t1_twist_tra_dyn-copy center · axial · 3.5mm · 0.83mm/px · 1 of 24 slices shown (10 of 24)]
[im 1/24]
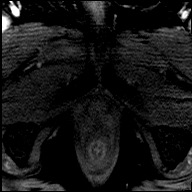

[Series 31: post t1_twist_tra_dyn-copy cent_sub_ttc=(id) · axial · 3.5mm · 0.83mm/px · 1 of 24 slices shown (9 of 22)]
[im 1/24]
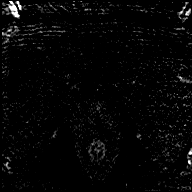

[Series 32: post t1_twist_tra_dyn-copy center · axial · 3.5mm · 0.83mm/px · 1 of 24 slices shown (11 of 24)]
[im 1/24]
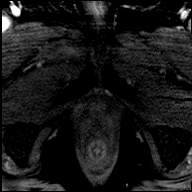

[Series 33: post t1_twist_tra_dyn-copy cent_sub_ttc=(id) · axial · 3.5mm · 0.83mm/px · 1 of 24 slices shown (10 of 22)]
[im 1/24]
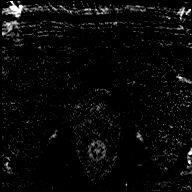

[Series 34: post t1_twist_tra_dyn-copy center · axial · 3.5mm · 0.83mm/px · 1 of 24 slices shown (12 of 24)]
[im 1/24]
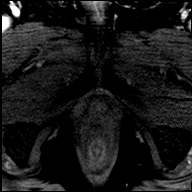

[Series 35: post t1_twist_tra_dyn-copy cent_sub_ttc=(id) · axial · 3.5mm · 0.83mm/px · 1 of 24 slices shown (11 of 22)]
[im 1/24]
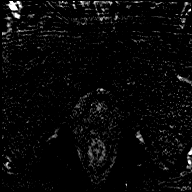

[Series 36: post t1_twist_tra_dyn-copy center · axial · 3.5mm · 0.83mm/px · 1 of 24 slices shown (13 of 24)]
[im 1/24]
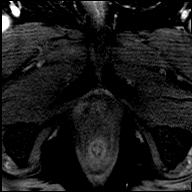

[Series 37: post t1_twist_tra_dyn-copy cent_sub_ttc=(id) · axial · 3.5mm · 0.83mm/px · 1 of 24 slices shown (12 of 22)]
[im 1/24]
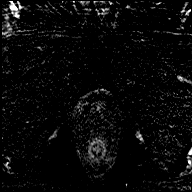

[Series 38: post t1_twist_tra_dyn-copy center · axial · 3.5mm · 0.83mm/px · 1 of 24 slices shown (14 of 24)]
[im 1/24]
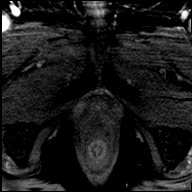

[Series 39: post t1_twist_tra_dyn-copy cent_sub_ttc=(id) · axial · 3.5mm · 0.83mm/px · 1 of 24 slices shown (13 of 22)]
[im 1/24]
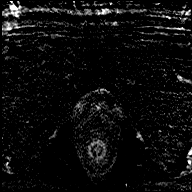

[Series 40: post t1_twist_tra_dyn-copy center · axial · 3.5mm · 0.83mm/px · 1 of 24 slices shown (15 of 24)]
[im 1/24]
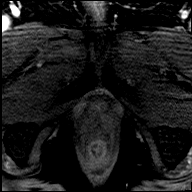

[Series 41: post t1_twist_tra_dyn-copy cent_sub_ttc=(id) · axial · 3.5mm · 0.83mm/px · 1 of 24 slices shown (14 of 22)]
[im 1/24]
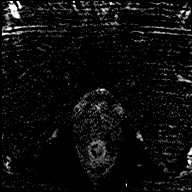

[Series 42: post t1_twist_tra_dyn-copy center · axial · 3.5mm · 0.83mm/px · 1 of 24 slices shown (16 of 24)]
[im 1/24]
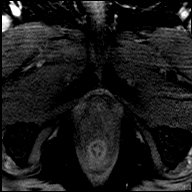

[Series 43: post t1_twist_tra_dyn-copy cent_sub_ttc=(id) · axial · 3.5mm · 0.83mm/px · 1 of 24 slices shown (15 of 22)]
[im 1/24]
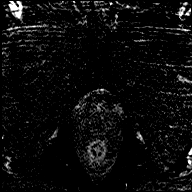

[Series 44: post t1_twist_tra_dyn-copy center · axial · 3.5mm · 0.83mm/px · 1 of 24 slices shown (17 of 24)]
[im 1/24]
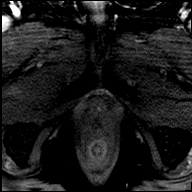

[Series 45: post t1_twist_tra_dyn-copy cent_sub_ttc=(id) · axial · 3.5mm · 0.83mm/px · 1 of 24 slices shown (16 of 22)]
[im 1/24]
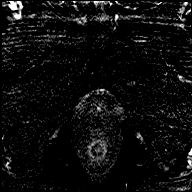

[Series 46: post t1_twist_tra_dyn-copy center · axial · 3.5mm · 0.83mm/px · 1 of 24 slices shown (18 of 24)]
[im 1/24]
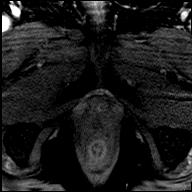

[Series 47: post t1_twist_tra_dyn-copy cent_sub_ttc=(id) · axial · 3.5mm · 0.83mm/px · 1 of 24 slices shown (17 of 22)]
[im 1/24]
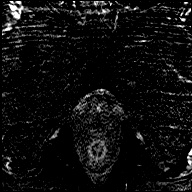

[Series 48: post t1_twist_tra_dyn-copy center · axial · 3.5mm · 0.83mm/px · 1 of 24 slices shown (19 of 24)]
[im 1/24]
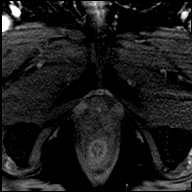

[Series 49: post t1_twist_tra_dyn-copy cent_sub_ttc=(id) · axial · 3.5mm · 0.83mm/px · 1 of 24 slices shown (18 of 22)]
[im 1/24]
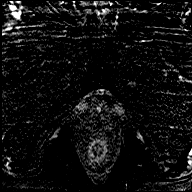

[Series 50: post t1_twist_tra_dyn-copy center · axial · 3.5mm · 0.83mm/px · 1 of 24 slices shown (20 of 24)]
[im 1/24]
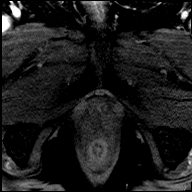

[Series 51: post t1_twist_tra_dyn-copy cent_sub_ttc=(id) · axial · 3.5mm · 0.83mm/px · 1 of 24 slices shown (19 of 22)]
[im 1/24]
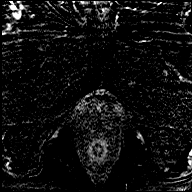

[Series 52: post t1_twist_tra_dyn-copy center · axial · 3.5mm · 0.83mm/px · 1 of 24 slices shown (21 of 24)]
[im 1/24]
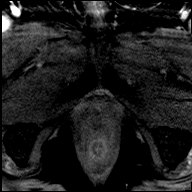

[Series 53: post t1_twist_tra_dyn-copy cent_sub_ttc=(id) · axial · 3.5mm · 0.83mm/px · 1 of 24 slices shown (20 of 22)]
[im 1/24]
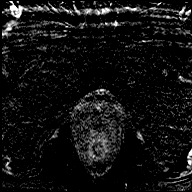

[Series 54: post t1_twist_tra_dyn-copy center · axial · 3.5mm · 0.83mm/px · 1 of 24 slices shown (22 of 24)]
[im 1/24]
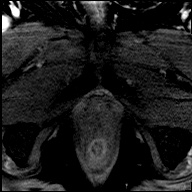

[Series 55: post t1_twist_tra_dyn-copy cent_sub_ttc=(id) · axial · 3.5mm · 0.83mm/px · 1 of 24 slices shown (21 of 22)]
[im 1/24]
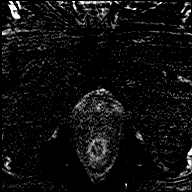

[Series 56: post t1_twist_tra_dyn-copy center · axial · 3.5mm · 0.83mm/px · 1 of 24 slices shown (23 of 24)]
[im 1/24]
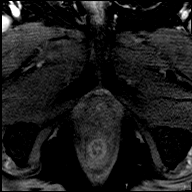

[Series 57: post t1_twist_tra_dyn-copy cent_sub_ttc=(id) · axial · 3.5mm · 0.83mm/px · 1 of 24 slices shown (22 of 22)]
[im 1/24]
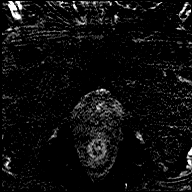

[Series 58: post t1_twist_tra_dyn-copy center · axial · 3.5mm · 0.83mm/px · 1 of 24 slices shown (24 of 24)]
[im 1/24]
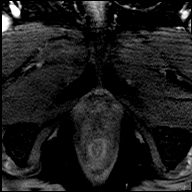

[56 of 56 positions shown; findings below may reference images not displayed]

FINDINGS: Prostate:

-- Peripheral Zone: Some residual post biopsy hemorrhage is seen
bilaterally. Ill-defined moderate to marked ADC and T2 hypointensity
is seen within the left posteromedial and posterolateral mid-gland
and apex, which shows early contrast enhancement on dynamic imaging.
This area is amorphous and difficult to measure, but is
approximately 2.5 by 2.1 cm. No focal areas of ADC hypointensity
seen in the right peripheral zone.

-- Transition/Central Zone: Circumscribed BPH nodules noted, but no
suspicious nodules with obscured or non-circumscribed margins seen.

-- Measurements/Volume:  6.1 x 5.8 x 6.5 cm (volume = 120 cm^3)

Transcapsular spread: Mild focal bulge and ill-defined margin is
seen along the capsular surface in the medial left apex, suspicious
for mild extracapsular extension.

Seminal vesicle involvement:  Absent

Neurovascular bundle involvement: No definite neurovascular bundle
involvement.

Pelvic adenopathy: None visualized

Bone metastasis: None visualized

Other: Mild diffuse bladder wall thickening, consistent with chronic
bladder outlet obstruction. 7 mm calculus within urinary bladder.
Sigmoid colon diverticulosis.
IMPRESSION: Large amorphous peripheral zone nodule in left mid-gland and apex
measuring 2.5 cm, highly suspicious for high-grade carcinoma.
PI-RADS 5: Very high (clinically significant cancer is highly likely
to be present)

Probable mild extracapsular extension in the left posteromedial
apex.

No definite neurovascular bundle or seminal vesicle involvement. No
evidence of pelvic metastatic disease.

Chronic bladder outlet obstruction and small urinary bladder
calculus incidentally noted.

## 2019-02-06 MED ORDER — GADOBENATE DIMEGLUMINE 529 MG/ML IV SOLN
20.0000 mL | Freq: Once | INTRAVENOUS | Status: AC | PRN
  Administered 2019-02-06: 20 mL via INTRAVENOUS

## 2020-04-30 ENCOUNTER — Other Ambulatory Visit (HOSPITAL_COMMUNITY): Payer: Self-pay | Admitting: Urology

## 2020-04-30 ENCOUNTER — Other Ambulatory Visit: Payer: Self-pay | Admitting: Urology

## 2020-04-30 DIAGNOSIS — C61 Malignant neoplasm of prostate: Secondary | ICD-10-CM

## 2020-05-03 ENCOUNTER — Other Ambulatory Visit: Payer: Self-pay | Admitting: Urology

## 2020-05-07 ENCOUNTER — Other Ambulatory Visit: Payer: Self-pay

## 2020-05-07 ENCOUNTER — Ambulatory Visit (HOSPITAL_COMMUNITY)
Admission: RE | Admit: 2020-05-07 | Discharge: 2020-05-07 | Disposition: A | Discharge status: Home / Self Care | Payer: Medicare PPO | Source: Ambulatory Visit | Attending: Urology | Admitting: Urology

## 2020-05-07 ENCOUNTER — Encounter (HOSPITAL_COMMUNITY)
Admission: RE | Admit: 2020-05-07 | Discharge: 2020-05-07 | Disposition: A | Discharge status: Home / Self Care | Payer: Medicare PPO | Source: Ambulatory Visit | Attending: Urology | Admitting: Urology

## 2020-05-07 DIAGNOSIS — C61 Malignant neoplasm of prostate: Secondary | ICD-10-CM | POA: Diagnosis present

## 2020-05-07 IMAGING — NM NM BONE WHOLE BODY
2 series · 2 of 2 positions shown · non-contrast
Comparison: None

Correlation: CT abdomen and pelvis 05/07/2020

CLINICAL DATA: Prostate cancer, PSA =7.51 on 08/13/2018

EXAM:
NUCLEAR MEDICINE WHOLE BODY BONE SCAN
TECHNIQUE: Whole body anterior and posterior images were obtained approximately
3 hours after intravenous injection of radiopharmaceutical.
RADIOPHARMACEUTICALS:  21.5 mCi 0echnetium-SSm MDP IV

[Series 1: whole body · 2.66mm/px · 1 of 1 slices shown (1 of 2)]
[im 1/1]
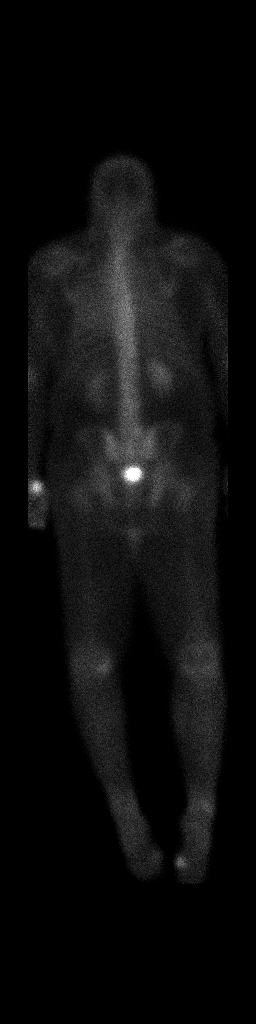

[Series 1: whole body · 2.66mm/px · 1 of 1 slices shown (2 of 2)]
[im 1/1]
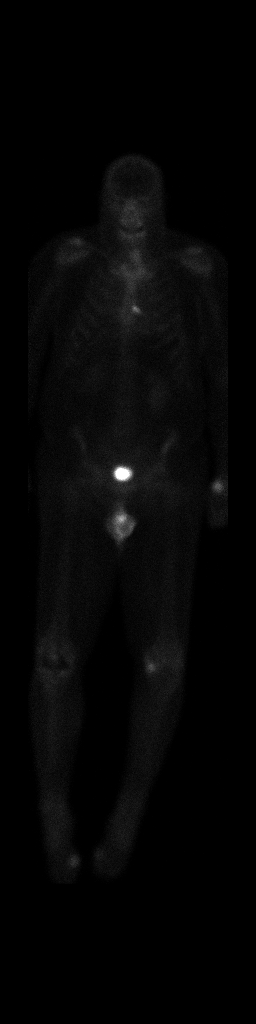

[2 of 2 positions shown; findings below may reference images not displayed]

FINDINGS: Uptake at BILATERAL shoulders, sternoclavicular joints, LEFT wrist,
LEFT knee, BILATERAL feet, typically degenerative.

Mild nonspecific uptake surrounding RIGHT knee prosthesis.

Mild urinary contamination at the perineum.

Focus of abnormal intense uptake identified at the LEFT lateral
aspect of sternum, extending beyond sternal margin; potentially this
may represent urinary contamination though metastasis is not
entirely excluded.

Technical artifact limits posterior view, no gross abnormalities
identified.

Expected soft tissue distribution of tracer.
IMPRESSION: Scattered degenerative type uptake with note of a RIGHT knee
prosthesis with mild nonspecific surrounding tracer uptake.

Focus of intense tracer accumulation LEFT lateral border of sternum,
due to degree of intensity potentially representing urinary
contamination of clothing.

No definite osseous metastases identified.

## 2020-05-07 MED ORDER — TECHNETIUM TC 99M MEDRONATE IV KIT
21.5000 | PACK | Freq: Once | INTRAVENOUS | Status: AC
  Administered 2020-05-07: 21.5 via INTRAVENOUS

## 2020-06-11 ENCOUNTER — Other Ambulatory Visit: Payer: Self-pay | Admitting: Urology

## 2020-06-14 ENCOUNTER — Other Ambulatory Visit: Payer: Self-pay | Admitting: Urology

## 2020-07-12 DIAGNOSIS — C61 Malignant neoplasm of prostate: Secondary | ICD-10-CM

## 2020-07-12 HISTORY — DX: Malignant neoplasm of prostate: C61

## 2020-07-12 NOTE — Progress Notes (Addendum)
COVID Vaccine Completed: Yes Date COVID Vaccine completed: unsure COVID vaccine manufacturer: [employer]    (not sure which one)  PCP - Dr. Kathryne Eriksson Cardiologist - N/A  Chest x-ray -  N/A EKG - 07/14/20 in epic  Stress Test -  N/A ECHO -  N/A Cardiac Cath -  N/A Pacemaker/ICD device last checked: N/A  Sleep Study - N/A CPAP - N/A  Fasting Blood Sugar - N/A Checks Blood Sugar _N/A____ times a day  Blood Thinner Instructions:  N/A Aspirin Instructions:  N/A Last Dose:  N/A  Anesthesia review: Afib on EKG 07/14/20  Patient denies shortness of breath, fever, cough and chest pain at PAT appointment   Patient verbalized understanding of instructions that were given to them at the PAT appointment. Patient was also instructed that they will need to review over the PAT instructions again at home before surgery.

## 2020-07-12 NOTE — Patient Instructions (Addendum)
DUE TO COVID-19 ONLY ONE VISITOR IS ALLOWED TO COME WITH YOU AND STAY IN THE WAITING ROOM ONLY DURING PRE OP AND PROCEDURE.   IF YOU WILL BE ADMITTED INTO THE HOSPITAL YOU ARE ALLOWED ONE SUPPORT PERSON DURING VISITATION HOURS ONLY (10AM -8PM)   . The support person may change daily. . The support person must pass our screening, gel in and out, and wear a mask at all times, including in the patient's room. . Patients must also wear a mask when staff or their support person are in the room.   COVID SWAB TESTING MUST BE COMPLETED ON:  Saturday, Nov. 6, 2021 at 11:10AM   [address] [place], [place] [address]  (Must self quarantine after testing. Follow instructions on handout.)       Your procedure is scheduled on: Wednesday, Nov. 10, 2021   Report to [facility] Main  Entrance    Report to admitting at 10:00 AM   Call this number if you have problems the morning of surgery [phone]   The day before surgery liquid diet  CLEAR LIQUID DIET  Foods Allowed                                                                     Foods Excluded  Water, Black Coffee and tea, regular and decaf                             liquids that you cannot  Plain Jell-O in any flavor  (No red)                                           see through such as: Fruit ices (not with fruit pulp)                                     milk, soups, orange juice              Iced Popsicles (No red)                                    All solid food                                   Apple juices Sports drinks like Gatorade (No red) Lightly seasoned clear broth or consume(fat free) Sugar, honey syrup  Sample Menu Breakfast                                Lunch                                     Supper Cranberry juice  Beef broth                            Chicken broth Jell-O                                     Grape juice                           Apple juice Coffee or tea                         Jell-O                                      Popsicle                                                Coffee or tea                        Coffee or tea      Magnesium Citrate: Drink by noon the day before surgery.    Oral Hygiene is also important to reduce your risk of infection.                                    Remember - BRUSH YOUR TEETH THE MORNING OF SURGERY WITH YOUR REGULAR TOOTHPASTE   Do NOT smoke after Midnight   Take these medicines the morning of surgery with A SIP OF WATER: Allopurinol, Amlodipine, Baclofen                               You may not have any metal on your body including jewelry, and body piercings             Do not wear lotions, powders, perfumes/cologne, or deodorant                          Men may shave face and neck.   Do not bring valuables to the hospital. [place].   Contacts, dentures or bridgework may not be worn into surgery.   Bring small overnight bag day of surgery.    Special Instructions: Bring a copy of your healthcare power of attorney and living will documents         the day of surgery if you haven't scanned them in before.              Please read over the following fact sheets you were given: IF YOU HAVE QUESTIONS ABOUT YOUR PRE OP INSTRUCTIONS PLEASE CALL [phone]   [place] - Preparing for Surgery Before surgery, you can play an important role.  Because skin is not sterile, your skin needs to be as free of germs as possible.  You can reduce the number of germs on  your skin by washing with CHG (chlorahexidine gluconate) soap before surgery.  CHG is an antiseptic cleaner which kills germs and bonds with the skin to continue killing germs even after washing. Please DO NOT use if you have an allergy to CHG or antibacterial soaps.  If your skin becomes reddened/irritated stop using the CHG and inform your nurse when you arrive at Short Stay. Do not shave (including legs  and underarms) for at least 48 hours prior to the first CHG shower.  You may shave your face/neck.  Please follow these instructions carefully:  1.  Shower with CHG Soap the night before surgery and the  morning of surgery.  2.  If you choose to wash your hair, wash your hair first as usual with your normal  shampoo.  3.  After you shampoo, rinse your hair and body thoroughly to remove the shampoo.                             4.  Use CHG as you would any other liquid soap.  You can apply chg directly to the skin and wash.  Gently with a scrungie or clean washcloth.  5.  Apply the CHG Soap to your body ONLY FROM THE NECK DOWN.   Do   not use on face/ open                           Wound or open sores. Avoid contact with eyes, ears mouth and   genitals (private parts).                       Wash face,  Genitals (private parts) with your normal soap.             6.  Wash thoroughly, paying special attention to the area where your    surgery  will be performed.  7.  Thoroughly rinse your body with warm water from the neck down.  8.  DO NOT shower/wash with your normal soap after using and rinsing off the CHG Soap.                9.  Pat yourself dry with a clean towel.            10.  Wear clean pajamas.            11.  Place clean sheets on your bed the night of your first shower and do not  sleep with pets. Day of Surgery : Do not apply any lotions/deodorants the morning of surgery.  Please wear clean clothes to the hospital/surgery center.  FAILURE TO FOLLOW THESE INSTRUCTIONS MAY RESULT IN THE CANCELLATION OF YOUR SURGERY  PATIENT SIGNATURE_________________________________  NURSE SIGNATURE__________________________________  ________________________________________________________________________

## 2020-07-14 ENCOUNTER — Other Ambulatory Visit: Payer: Self-pay

## 2020-07-14 ENCOUNTER — Encounter (HOSPITAL_COMMUNITY)
Admission: RE | Admit: 2020-07-14 | Discharge: 2020-07-14 | Disposition: A | Discharge status: Home / Self Care | Payer: Medicare PPO | Source: Ambulatory Visit | Attending: Urology | Admitting: Urology

## 2020-07-14 ENCOUNTER — Encounter (INDEPENDENT_AMBULATORY_CARE_PROVIDER_SITE_OTHER): Payer: Self-pay

## 2020-07-14 ENCOUNTER — Encounter (HOSPITAL_COMMUNITY): Payer: Self-pay

## 2020-07-14 DIAGNOSIS — Z7901 Long term (current) use of anticoagulants: Secondary | ICD-10-CM | POA: Diagnosis not present

## 2020-07-14 DIAGNOSIS — C61 Malignant neoplasm of prostate: Secondary | ICD-10-CM | POA: Diagnosis not present

## 2020-07-14 DIAGNOSIS — N210 Calculus in bladder: Secondary | ICD-10-CM | POA: Diagnosis not present

## 2020-07-14 DIAGNOSIS — I10 Essential (primary) hypertension: Secondary | ICD-10-CM | POA: Diagnosis not present

## 2020-07-14 DIAGNOSIS — Z01818 Encounter for other preprocedural examination: Secondary | ICD-10-CM | POA: Insufficient documentation

## 2020-07-14 HISTORY — DX: Essential (primary) hypertension: I10

## 2020-07-14 HISTORY — DX: Personal history of urinary calculi: Z87.442

## 2020-07-14 HISTORY — DX: Malignant neoplasm of prostate: C61

## 2020-07-14 LAB — BASIC METABOLIC PANEL
Anion gap: 8 (ref 5–15)
BUN: 17 mg/dL (ref 8–23)
CO2: 27 mmol/L (ref 22–32)
Calcium: 9.1 mg/dL (ref 8.9–10.3)
Chloride: 105 mmol/L (ref 98–111)
Creatinine, Ser: 1.21 mg/dL (ref 0.61–1.24)
GFR, Estimated: >60 mL/min (ref >60)
Glucose, Bld: 99 mg/dL (ref 70–99)
Potassium: 4.1 mmol/L (ref 3.5–5.1)
Sodium: 140 mmol/L (ref 135–145)

## 2020-07-14 LAB — CBC
HCT: 51.3 % (ref 39.0–52.0)
Hemoglobin: 17.5 g/dL — ABNORMAL HIGH (ref 13.0–17.0)
MCH: 31.8 pg (ref 26.0–34.0)
MCHC: 34.1 g/dL (ref 30.0–36.0)
MCV: 93.3 fL (ref 80.0–100.0)
Platelets: 183 10*3/uL (ref 150–400)
RBC: 5.5 MIL/uL (ref 4.22–5.81)
RDW: 13.7 % (ref 11.5–15.5)
WBC: 6.4 10*3/uL (ref 4.0–10.5)
nRBC: 0 % (ref 0.0–0.2)

## 2020-07-16 ENCOUNTER — Telehealth: Payer: Self-pay | Admitting: *Deleted

## 2020-07-16 ENCOUNTER — Ambulatory Visit (HOSPITAL_COMMUNITY): Payer: Medicare PPO | Attending: Cardiology

## 2020-07-16 ENCOUNTER — Other Ambulatory Visit: Payer: Self-pay

## 2020-07-16 DIAGNOSIS — Z01818 Encounter for other preprocedural examination: Secondary | ICD-10-CM

## 2020-07-16 DIAGNOSIS — I4891 Unspecified atrial fibrillation: Secondary | ICD-10-CM | POA: Diagnosis not present

## 2020-07-16 LAB — ECHOCARDIOGRAM COMPLETE
P 1/2 time: 703 msec
S' Lateral: 3.3 cm

## 2020-07-16 MED ORDER — PERFLUTREN LIPID MICROSPHERE
1.0000 mL | INTRAVENOUS | Status: AC | PRN
  Administered 2020-07-16: 2 mL via INTRAVENOUS

## 2020-07-16 NOTE — Progress Notes (Signed)
Placed order for echo to be done ASAP for patient's up coming surgery.

## 2020-07-16 NOTE — Telephone Encounter (Signed)
   [facility] Pre-operative Risk Assessment    HEARTCARE STAFF: - Please ensure there is not already an duplicate clearance open for this procedure. - Under Visit Info/Reason for Call, type in Other and utilize the format Clearance MM/DD/YY or Clearance TBD. Do not use dashes or single digits. - If request is for dental extraction, please clarify the # of teeth to be extracted.  Request for surgical clearance: PT HAS A NEW PT APPT WITH DR. Johnsie Cancel 07/19/20  1. What type of surgery is being performed? ROBOTIC PROSTATECTOMY, B/L PELVIC LYMPHANDENECTOMY   2. When is this surgery scheduled? 07/21/20  3. What type of clearance is required (medical clearance vs. Pharmacy clearance to hold med vs. Both)? MEDICAL  4. Are there any medications that need to be held prior to surgery and how long? NONE LISTED    5. Practice name and name of physician performing surgery? [facility]; DR. Alexis Frock    6. What is the office phone number? [phone]   7.   What is the office fax number? [phone]  8.   Anesthesia type (None, local, MAC, general) ? NOT LISTED    Julaine Hua 07/16/2020, 10:16 AM  _________________________________________________________________   (provider comments below)

## 2020-07-17 ENCOUNTER — Other Ambulatory Visit (HOSPITAL_COMMUNITY)
Admission: RE | Admit: 2020-07-17 | Discharge: 2020-07-17 | Disposition: A | Discharge status: Home / Self Care | Payer: Medicare PPO | Source: Ambulatory Visit | Attending: Urology | Admitting: Urology

## 2020-07-17 DIAGNOSIS — Z20822 Contact with and (suspected) exposure to covid-19: Secondary | ICD-10-CM | POA: Diagnosis not present

## 2020-07-17 DIAGNOSIS — Z01812 Encounter for preprocedural laboratory examination: Secondary | ICD-10-CM | POA: Insufficient documentation

## 2020-07-17 LAB — SARS CORONAVIRUS 2 (TAT 6-24 HRS): SARS Coronavirus 2: NEGATIVE

## 2020-07-18 NOTE — Progress Notes (Signed)
CARDIOLOGY CONSULT NOTE       Patient ID: [name] [name] MRN: [id number] DOB/AGE: 1946-06-21 74 y.o.  Admit date: (Not on file) Referring Physician: [name]/ [name] Primary  Primary Physician: [name] [place], MD Primary Cardiologist: New Reason for Consultation: Afib / Preoperative clearance  Active Problems:   * No active hospital problems. *   HPI:  74 y.o. referred by Dr [name] for preoperative clearance. ECG done 07/14/20 routine preoperative showed afib at controlled rate with normal ST segments. Patient was unaware of rhythm with no previous cardiovascular disease He is overweight, HTN on norvasc and has prostate cancer. He is already scheduled for roobotic prostatectomy B?L pelvic lymphadenectomy on 11/10 with Dr [name] CHADVASC score is 2 ( HTN/AGE) giving him a 2.2% yearly risk of stroke. He has not had any anesthetic or bleeding issues in past He had a TRK replacement 2014 with no issues. 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 Needs surgery to prevent spread of cancer.  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 Arthritis   . Frequency of urination   . Hematuria   . History of gout   . History of kidney stones   . Hypertension   . Pneumonia 1981   History of  . Prostate cancer ([place])   . Right ureteral stone   . Urgency of urination     History  reviewed. No pertinent family history.  Social History   Socioeconomic History  . Marital status: Married    Spouse name: Not on file  . Number of children: Not on file  . Years of education: Not on file  . Highest education level: Not on file  Occupational History  . Not on file  Tobacco Use  . Smoking status: Never Smoker  . Smokeless tobacco: Former Systems developer    Types: Secondary school teacher  . Vaping Use: Never used  Substance and Sexual Activity  . Alcohol use: Yes    Comment: social  . Drug use: No  . Sexual activity: Not on file  Other Topics Concern  . Not on file  Social History Narrative   ** Merged History Encounter **       Social Determinants of Health   Financial Resource Strain:   . Difficulty of Paying Living Expenses: Not on file  Food Insecurity:   . Worried About Charity fundraiser in the Last Year: Not on file  . Ran Out of Food in the Last Year: Not on file  Transportation Needs:   . Lack of Transportation (Medical): Not on file  . Lack of Transportation (Non-Medical): Not on file  Physical Activity:   . Days of Exercise per Week: Not on file  . Minutes of Exercise per Session: Not on file  Stress:   .  Feeling of Stress : Not on file  Social Connections:   . Frequency of Communication with Friends and Family: Not on file  . Frequency of Social Gatherings with Friends and Family: Not on file  . Attends Religious Services: Not on file  . Active Member of Clubs or Organizations: Not on file  . Attends Archivist Meetings: Not on file  . Marital Status: Not on file  Intimate Partner Violence:   . Fear of Current or Ex-Partner: Not on file  . Emotionally Abused: Not on file  . Physically Abused: Not on file  . Sexually Abused: Not on file    Past Surgical History:  Procedure Laterality Date  . ACHILLES TENDON SURGERY Left   . CHOLECYSTECTOMY    . CYST EXCISION     finger  . CYSTO/  RETROGRADE PYELOGRAM/ RIGHT URETERAL PLACEMENT  04-04-2013   . CYSTOSCOPY WITH RETROGRADE PYELOGRAM, URETEROSCOPY AND STENT PLACEMENT Right 04/04/2013   Procedure: CYSTOSCOPY WITH RIGHT RETROGRADE PYELOGRAM, AND RIGHT STENT PLACEMENT, DIAGNOSTIC RIGHT URETEROSCOPY;  Surgeon: Alexis Frock, MD;  Location: [facility];  Service: Urology;  Laterality: Right;  . CYSTOSCOPY WITH URETEROSCOPY AND STENT PLACEMENT Right 05/07/2013   Procedure: CYSTOSCOPY WITH URETEROSCOPY AND STENT PLACEMENT;  Surgeon: Alexis Frock, MD;  Location[facility];  Service: Urology;  Laterality: Right;  . HOLMIUM LASER APPLICATION Right 12/08/5186   Procedure: HOLMIUM LASER APPLICATION;  Surgeon: Alexis Frock, MD;  Location: [facility];  Service: Urology;  Laterality: Right;  . KNEE ARTHROSCOPY W/ MENISCECTOMY Right   . LAPAROSCOPIC CHOLECYSTECTOMY  2003  . TOTAL KNEE ARTHROPLASTY  10/08/2012   Procedure: TOTAL KNEE ARTHROPLASTY;  Surgeon: Sydnee Cabal, MD;  Location: [facility];  Service: Orthopedics;  Laterality: Right;      Current Outpatient Medications:  .  acetaminophen (TYLENOL) 500 MG tablet, Take 1,000 mg by mouth every 6 (six) hours as needed for moderate pain or headache. , Disp: , Rfl:  .  allopurinol (ZYLOPRIM) 100 MG tablet, Take 100 mg by mouth in the morning and at bedtime., Disp: , Rfl:  .  baclofen (LIORESAL) 10 MG tablet, Take 10 mg by mouth once a week., Disp: , Rfl:  .  fluticasone (FLONASE) 50 MCG/ACT nasal spray, Place 1 spray into both nostrils once a week., Disp: , Rfl:  .  sildenafil (VIAGRA) 100 MG tablet, Take 100 mg by mouth daily as needed for erectile dysfunction., Disp: , Rfl:     Physical Exam: There were no vitals taken for this visit.    Affect appropriate Healthy:  appears stated age 61: normal Neck supple with no adenopathy JVP normal no bruits no thyromegaly Lungs clear with no wheezing and good diaphragmatic motion Heart:  S1/S2 no murmur, no rub, gallop or click PMI normal Abdomen: benighn, post  cholecystectomy  Distal pulses intact with no bruits No edema Neuro non-focal Skin warm and dry Post right TKR    Labs:   Lab Results  Component Value Date   WBC 6.4 07/14/2020   HGB 17.5 (H) 07/14/2020   HCT 51.3 07/14/2020   MCV 93.3 07/14/2020   PLT 183 07/14/2020    Recent Labs  Lab 07/14/20 1130  NA 140  K 4.1  CL 105  CO2 27  BUN 17  CREATININE 1.21  CALCIUM 9.1  GLUCOSE 99   No results found for: CKTOTAL, CKMB, CKMBINDEX, TROPONINI No results found for: CHOL No results found for: HDL No results found for: LDLCALC No results  found for: TRIG No results found for: CHOLHDL No results found for: LDLDIRECT    Radiology: ECHOCARDIOGRAM COMPLETE  Result Date: 07/16/2020    ECHOCARDIOGRAM REPORT   Patient Name:   ZAMEER BORMAN Date of Exam: 07/16/2020 Medical Rec #:  [id number]        Height:       76.0 in Accession #:    [id number]       Weight:       289.0 lb Date of Birth:  [birth year]        BSA:          2.590 m Patient Age:    60 years         BP:           189/70 mmHg Patient Gender: M                HR:           57 bpm. Exam Location:  [place] Procedure: 2D Echo, Cardiac Doppler, Color Doppler and Intracardiac            Opacification Agent Indications:    I48.91 Atrial Fibrillation                 Z01.818 Preoperative examination  History:        Patient has no prior history of Echocardiogram examinations.                 Risk Factors:Hypertension.  Sonographer:    Wilford Sports Rodgers-Jones RDCS Referring Phys: [place]  1. Left ventricular ejection fraction, by estimation, is 55 to 60%. The left ventricle has normal function. The left ventricle has no regional wall motion abnormalities. There is moderate concentric left ventricular hypertrophy. Left ventricular diastolic parameters are indeterminate.  2. Right ventricular systolic function is normal. The right ventricular size is normal.  3. Left atrial size was mildly dilated.  4. The mitral  valve is normal in structure. Trivial mitral valve regurgitation.  5. The aortic valve is tricuspid. Aortic valve regurgitation is mild.  6. Aortic dilatation noted. There is mild dilatation of the aortic root, measuring 40 mm. There is moderate dilatation of the ascending aorta, measuring 45 mm.  7. The inferior vena cava is normal in size with greater than 50% respiratory variability, suggesting right atrial pressure of 3 mmHg. Comparison(s): No prior Echocardiogram. FINDINGS  Left Ventricle: Left ventricular ejection fraction, by estimation, is 55 to 60%. The left ventricle has normal function. The left ventricle has no regional wall motion abnormalities. Definity contrast agent was given IV to delineate the left ventricular  endocardial borders. The left ventricular internal cavity size was normal in size. There is moderate concentric left ventricular hypertrophy. Left ventricular diastolic parameters are indeterminate. Right Ventricle: The right ventricular size is normal. Right vetricular wall thickness was not well visualized. Right ventricular systolic function is normal. Left Atrium: Left atrial size was mildly dilated. Right Atrium: Right atrial size was normal in size. Pericardium: There is no evidence of pericardial effusion. Mitral Valve: The mitral valve is normal in structure. There is mild thickening of the mitral valve leaflet(s). There is mild calcification of the mitral valve leaflet(s). Mild mitral annular calcification. Trivial mitral valve regurgitation. Tricuspid Valve: The tricuspid valve is normal in structure. Tricuspid valve regurgitation is mild. Aortic Valve: The aortic valve is tricuspid. Aortic valve regurgitation is mild. Aortic regurgitation PHT measures 703 msec. Pulmonic Valve: The pulmonic valve was normal in structure.  Pulmonic valve regurgitation is trivial. Aorta: Aortic dilatation noted. There is mild dilatation of the aortic root, measuring 40 mm. There is moderate dilatation  of the ascending aorta, measuring 45 mm. Venous: The inferior vena cava is normal in size with greater than 50% respiratory variability, suggesting right atrial pressure of 3 mmHg. IAS/Shunts: No atrial level shunt detected by color flow Doppler.  LEFT VENTRICLE PLAX 2D LVIDd:         4.80 cm LVIDs:         3.30 cm LV PW:         1.40 cm LV IVS:        1.40 cm LVOT diam:     2.30 cm LV SV:         73 LV SV Index:   28 LVOT Area:     4.15 cm  RIGHT VENTRICLE RV Basal diam:  4.40 cm RV S prime:     13.70 cm/s TAPSE (M-mode): 1.6 cm LEFT ATRIUM             Index       RIGHT ATRIUM           Index LA diam:        5.10 cm 1.97 cm/m  RA Area:     16.40 cm LA Vol (A2C):   99.9 ml 38.57 ml/m RA Volume:   45.10 ml  17.41 ml/m LA Vol (A4C):   86.9 ml 33.55 ml/m LA Biplane Vol: 95.7 ml 36.94 ml/m  AORTIC VALVE LVOT Vmax:   86.45 cm/s LVOT Vmean:  64.350 cm/s LVOT VTI:    0.175 m AI PHT:      703 msec  AORTA Ao Root diam: 4.00 cm Ao Asc diam:  4.50 cm MV E velocity: 85.87 cm/s  TRICUSPID VALVE                            TR Peak grad:   26.0 mmHg                            TR Vmax:        255.00 cm/s                             SHUNTS                            Systemic VTI:  0.18 m                            Systemic Diam: 2.30 cm Gwyndolyn Kaufman MD Electronically signed by Gwyndolyn Kaufman MD Signature Date/Time: 07/16/2020/3:49:17 PM    Final     EKG: see HPI11/8/21 afib rate 65 nonspecific ST changes    ASSESSMENT AND PLAN:   1. Preoperative:  Clear to have general anesthesia and prostate surgery this week 2. Afib:  Rate control fine f/u 4 weeks start DOAC post operatively  3. HTN:  Improved on beta blocker  4. Dilated Aorta:  F/u CTA in a year BP well controlled on beta blocker now   F/U in 4 weeks   Signed: Jenkins Rouge 07/19/2020, 10:15 AM

## 2020-07-19 ENCOUNTER — Ambulatory Visit: Payer: Medicare PPO | Admitting: Cardiovascular Disease

## 2020-07-19 ENCOUNTER — Other Ambulatory Visit: Payer: Self-pay

## 2020-07-19 ENCOUNTER — Encounter: Payer: Self-pay | Admitting: Cardiovascular Disease

## 2020-07-19 VITALS — BP 114/80 | HR 65 | Ht 76.0 in | Wt 291.4 lb

## 2020-07-19 DIAGNOSIS — I10 Essential (primary) hypertension: Secondary | ICD-10-CM | POA: Diagnosis not present

## 2020-07-19 DIAGNOSIS — Z01818 Encounter for other preprocedural examination: Secondary | ICD-10-CM

## 2020-07-19 DIAGNOSIS — I4891 Unspecified atrial fibrillation: Secondary | ICD-10-CM | POA: Diagnosis not present

## 2020-07-19 DIAGNOSIS — I712 Thoracic aortic aneurysm, without rupture, unspecified: Secondary | ICD-10-CM

## 2020-07-19 NOTE — Patient Instructions (Signed)
Medication Instructions:  *If you need a refill on your cardiac medications before your next appointment, please call your pharmacy*  Lab Work: If you have labs (blood work) drawn today and your tests are completely normal, you will receive your results only by: Marland Kitchen MyChart Message (if you have MyChart) OR . A paper copy in the mail If you have any lab test that is abnormal or we need to change your treatment, we will call you to review the results.  Follow-Up: At [facility], you and your health needs are our priority.  As part of our continuing mission to provide you with exceptional heart care, we have created designated Provider Care Teams.  These Care Teams include your primary Cardiologist (physician) and Advanced Practice Providers (APPs -  Physician Assistants and Nurse Practitioners) who all work together to provide you with the care you need, when you need it.  We recommend signing up for the patient portal called "MyChart".  Sign up information is provided on this After Visit Summary.  MyChart is used to connect with patients for Virtual Visits (Telemedicine).  Patients are able to view lab/test results, encounter notes, upcoming appointments, etc.  Non-urgent messages can be sent to your provider as well.   To learn more about what you can do with MyChart, go to [email or web address].    Your next appointment:   4 week(s)  The format for your next appointment:   In Person  Provider:   You may see Dr. Johnsie Cancel or one of the following Advanced Practice Providers on your designated Care Team:    Truitt Merle, NP  Cecilie Kicks, NP  Kathyrn Drown, NP

## 2020-07-19 NOTE — Anesthesia Preprocedure Evaluation (Addendum)
Anesthesia Evaluation  Patient identified by MRN, date of birth, ID band Patient awake    Reviewed: Patient's Chart, lab work & pertinent test results, reviewed documented beta blocker date and time   Airway Mallampati: II  TM Distance: >3 FB Neck ROM: Full    Dental  (+) Teeth Intact   Pulmonary neg pulmonary ROS,    Pulmonary exam normal        Cardiovascular hypertension, Pt. on medications and Pt. on home beta blockers + dysrhythmias Atrial Fibrillation  Rhythm:Irregular Rate:Normal     Neuro/Psych negative neurological ROS  negative psych ROS   GI/Hepatic negative GI ROS, Neg liver ROS,   Endo/Other  negative endocrine ROS  Renal/GU History of stones   Prostate Ca    Musculoskeletal  (+) Arthritis , Osteoarthritis,    Abdominal (+)  Abdomen: soft. Bowel sounds: normal.  Peds  Hematology negative hematology ROS (+)   Anesthesia Other Findings   Reproductive/Obstetrics                           Anesthesia Physical Anesthesia Plan  ASA: III  Anesthesia Plan: General   Post-op Pain Management:    Induction: Intravenous  PONV Risk Score and Plan: 2 and Ondansetron and Treatment may vary due to age or medical condition  Airway Management Planned: Mask and Oral ETT  Additional Equipment: None  Intra-op Plan:   Post-operative Plan: Extubation in OR  Informed Consent: I have reviewed the patients History and Physical, chart, labs and discussed the procedure including the risks, benefits and alternatives for the proposed anesthesia with the patient or authorized representative who has indicated his/her understanding and acceptance.     Dental advisory given  Plan Discussed with: CRNA  Anesthesia Plan Comments: (See PAT note 07/14/2020, [name], PA-C, per note: "EKG at PAT visit with new onset A-fib, no prior EKGs to compare.  Pt denies history of a-fib.   Asymptomatic.  Rate controlled, 62 bpm.  Appointment made with cardio for evaluation prior to surgery. Seen by cardiology 07/19/2020.  Echo 07/16/2020 with EF 55-60%, valves normal, moderate dilatation of ascending aorta measuring 60mm.  F/u CTA to be scheduled in 1 year.  Per Dr. Johnsie Cancel, "Clear to have general anesthesia and prostate surgery this week."  DOAC to be started post operatively."   Lab Results      Component                Value               Date                      WBC                      6.4                 07/14/2020                HGB                      17.5 (H)            07/14/2020                HCT                      51.3  07/14/2020                MCV                      93.3                07/14/2020                PLT                      183                 07/14/2020           ECHO 11/21: Left ventricular ejection fraction, by estimation, is 55 to 60%. The left ventricle has normal function. The left ventricle has no regional wall motion abnormalities. There is moderate concentric left ventricular hypertrophy. Left ventricular diastolic parameters are indeterminate. 2. Right ventricular systolic function is normal. The right ventricular size is normal. 3. Left atrial size was mildly dilated. 4. The mitral valve is normal in structure. Trivial mitral valve regurgitation. 5. The aortic valve is tricuspid. Aortic valve regurgitation is mild. 6. Aortic dilatation noted. There is mild dilatation of the aortic root, measuring 40 mm. There is moderate dilatation of the ascending aorta, measuring 45 mm. 7. The inferior vena cava is normal in size with greater than 50% respiratory variability, suggesting right atrial pressure of 3 mmHg.)       Anesthesia Quick Evaluation

## 2020-07-19 NOTE — Progress Notes (Signed)
Anesthesia Chart Review   Case: [id number] Date/Time: 07/21/20 1145   Procedures:      XI ROBOTIC ASSISTED LAPAROSCOPIC RADICAL PROSTATECTOMY (N/A ) - 3 HRS     LYMPHADENECTOMY (Bilateral )     CYSTOSCOPY WITH LITHOLAPAXY (N/A )     HOLMIUM LASER APPLICATION (N/A )   Anesthesia type: General   Pre-op diagnosis: PROSTATE CANCER, BLADDER STONE   Location: [facility] 03 / [facility]   Surgeons: [name] Frock, MD      DISCUSSION:74 y.o. never smoker with h/o HTN, prostate cancer, bladder stone scheduled for above procedure 07/21/2020 with Dr. Alexis [name].   EKG at PAT visit with new onset A-fib, no prior EKGs to compare.  Pt denies history of a-fib.  Asymptomatic.  Rate controlled, 62 bpm.  Appointment made with cardio for evaluation prior to surgery.    Seen by cardiology 07/19/2020.  Echo 07/16/2020 with EF 55-60%, valves normal, moderate dilatation of ascending aorta measuring 44mm.  F/u CTA to be scheduled in 1 year.  Per Dr. Johnsie [name], "Clear to have general anesthesia and prostate surgery this week."  DOAC to be started post operatively.   Anticipate pt can proceed with planned procedure barring acute status change.   VS: BP (!) 169/70   Pulse 80   Temp 36.5 C (Oral)   Resp 16   Ht 6\' 4"  (1.93 m)   Wt 131.1 kg   SpO2 97%   BMI 35.18 kg/m   PROVIDERS: [name] Sacramento, MD is PCP   [name] Rouge, MD is Cardiologist  LABS: Labs reviewed: Acceptable for surgery. (all labs ordered are listed, but only abnormal results are displayed)  Labs Reviewed  CBC - Abnormal; Notable for the following components:      Result Value   Hemoglobin 17.5 (*)    All other components within normal limits  BASIC METABOLIC PANEL     IMAGES:   EKG: 07/16/2020 Rate 62 bpm  Atrial fibrillation with a competing junctional pacemaker Incomplete right bundle branch block Possible Anterior infarct , age undetermined Abnormal ECG No old tracing to compare  CV: Echo 07/16/2020 IMPRESSIONS    1.  Left ventricular ejection fraction, by estimation, is 55 to 60%. The  left ventricle has normal function. The left ventricle has no regional  wall motion abnormalities. There is moderate concentric left ventricular  hypertrophy. Left ventricular  diastolic parameters are indeterminate.  2. Right ventricular systolic function is normal. The right ventricular  size is normal.  3. Left atrial size was mildly dilated.  4. The mitral valve is normal in structure. Trivial mitral valve  regurgitation.  5. The aortic valve is tricuspid. Aortic valve regurgitation is mild.  6. Aortic dilatation noted. There is mild dilatation of the aortic root,  measuring 40 mm. There is moderate dilatation of the ascending aorta,  measuring 45 mm.  7. The inferior vena cava is normal in size with greater than 50%  respiratory variability, suggesting right atrial pressure of 3 mmHg.  Past Medical History:  Diagnosis Date  . Arthritis   . Frequency of urination   . Hematuria   . History of gout   . History of kidney stones   . Hypertension   . Pneumonia 1981   History of  . Prostate cancer ([place])   . Right ureteral stone   . Urgency of urination     Past Surgical History:  Procedure Laterality Date  . ACHILLES TENDON SURGERY Left   . CHOLECYSTECTOMY    .  CYST EXCISION     finger  . CYSTO/  RETROGRADE PYELOGRAM/ RIGHT URETERAL PLACEMENT  04-04-2013  . CYSTOSCOPY WITH RETROGRADE PYELOGRAM, URETEROSCOPY AND STENT PLACEMENT Right 04/04/2013   Procedure: CYSTOSCOPY WITH RIGHT RETROGRADE PYELOGRAM, AND RIGHT STENT PLACEMENT, DIAGNOSTIC RIGHT URETEROSCOPY;  Surgeon: [name] Frock, MD;  Location[facility];  Service: Urology;  Laterality: Right;  . CYSTOSCOPY WITH URETEROSCOPY AND STENT PLACEMENT Right 05/07/2013   Procedure: CYSTOSCOPY WITH URETEROSCOPY AND STENT PLACEMENT;  Surgeon: [name] Frock, MD;  Location: [facility];  Service: Urology;  Laterality: Right;  . HOLMIUM LASER  APPLICATION Right 01/16/2256   Procedure: HOLMIUM LASER APPLICATION;  Surgeon: [name] Frock, MD;  Location: [facility];  Service: Urology;  Laterality: Right;  . KNEE ARTHROSCOPY W/ MENISCECTOMY Right   . LAPAROSCOPIC CHOLECYSTECTOMY  2003  . TOTAL KNEE ARTHROPLASTY  10/08/2012   Procedure: TOTAL KNEE ARTHROPLASTY;  Surgeon: Sydnee Cabal, MD;  Location: [facility];  Service: Orthopedics;  Laterality: Right;    MEDICATIONS: . acetaminophen (TYLENOL) 500 MG tablet  . allopurinol (ZYLOPRIM) 100 MG tablet  . fluticasone (FLONASE) 50 MCG/ACT nasal spray  . metoprolol succinate (TOPROL-XL) 25 MG 24 hr tablet  . sildenafil (VIAGRA) 100 MG tablet   No current facility-administered medications for this encounter.     [name] Felix, PA-C [facility] [phone]

## 2020-07-20 MED ORDER — DEXTROSE 5 % IV SOLN
3.0000 g | Freq: Once | INTRAVENOUS | Status: AC
  Administered 2020-07-21: 3 g via INTRAVENOUS
  Filled 2020-07-20: qty 3

## 2020-07-21 ENCOUNTER — Ambulatory Visit (HOSPITAL_COMMUNITY): Payer: Medicare PPO | Admitting: Certified Registered"

## 2020-07-21 ENCOUNTER — Observation Stay (HOSPITAL_COMMUNITY)
Admission: RE | Admit: 2020-07-21 | Discharge: 2020-07-23 | Disposition: A | Discharge status: Home / Self Care | Payer: Medicare PPO | Attending: Urology | Admitting: Urology

## 2020-07-21 ENCOUNTER — Encounter (HOSPITAL_COMMUNITY)
Admission: RE | Disposition: A | Discharge status: Home / Self Care | Payer: Self-pay | Source: Home / Self Care | Attending: Urology

## 2020-07-21 ENCOUNTER — Ambulatory Visit (HOSPITAL_COMMUNITY): Payer: Medicare PPO | Admitting: Physician Assistant

## 2020-07-21 ENCOUNTER — Other Ambulatory Visit: Payer: Self-pay

## 2020-07-21 ENCOUNTER — Encounter (HOSPITAL_COMMUNITY): Payer: Self-pay | Admitting: Urology

## 2020-07-21 DIAGNOSIS — N210 Calculus in bladder: Secondary | ICD-10-CM | POA: Diagnosis not present

## 2020-07-21 DIAGNOSIS — C61 Malignant neoplasm of prostate: Secondary | ICD-10-CM | POA: Diagnosis not present

## 2020-07-21 DIAGNOSIS — K4020 Bilateral inguinal hernia, without obstruction or gangrene, not specified as recurrent: Secondary | ICD-10-CM | POA: Diagnosis not present

## 2020-07-21 DIAGNOSIS — Z79899 Other long term (current) drug therapy: Secondary | ICD-10-CM | POA: Insufficient documentation

## 2020-07-21 HISTORY — PX: ROBOT ASSISTED LAPAROSCOPIC RADICAL PROSTATECTOMY: SHX5141

## 2020-07-21 HISTORY — PX: LYMPHADENECTOMY: SHX5960

## 2020-07-21 HISTORY — DX: Unspecified atrial fibrillation: I48.91

## 2020-07-21 LAB — HEMOGLOBIN AND HEMATOCRIT, BLOOD
HCT: 48.5 % (ref 39.0–52.0)
Hemoglobin: 15.9 g/dL (ref 13.0–17.0)

## 2020-07-21 LAB — TYPE AND SCREEN
ABO/RH(D): A POS
Antibody Screen: NEGATIVE

## 2020-07-21 SURGERY — PROSTATECTOMY, RADICAL, ROBOT-ASSISTED, LAPAROSCOPIC
Anesthesia: General | Site: Abdomen

## 2020-07-21 MED ORDER — LACTATED RINGERS IV SOLN: INTRAVENOUS | Status: DC

## 2020-07-21 MED ORDER — HYDROMORPHONE HCL 1 MG/ML IJ SOLN
0.2500 mg | INTRAMUSCULAR | Status: DC | PRN
  Administered 2020-07-21 (×2): 0.5 mg via INTRAVENOUS

## 2020-07-21 MED ORDER — BACITRACIN-NEOMYCIN-POLYMYXIN 400-5-5000 EX OINT: 1.0000 "application " | TOPICAL_OINTMENT | Freq: Three times a day (TID) | CUTANEOUS | Status: DC | PRN

## 2020-07-21 MED ORDER — ACETAMINOPHEN 10 MG/ML IV SOLN: 1000.0000 mg | Freq: Once | INTRAVENOUS | Status: DC | PRN

## 2020-07-21 MED ORDER — SUGAMMADEX SODIUM 500 MG/5ML IV SOLN
INTRAVENOUS | Status: AC
  Filled 2020-07-21: qty 5

## 2020-07-21 MED ORDER — OXYCODONE HCL 5 MG PO TABS
5.0000 mg | ORAL_TABLET | ORAL | Status: DC | PRN
  Administered 2020-07-22 – 2020-07-23 (×4): 5 mg via ORAL
  Filled 2020-07-21 (×5): qty 1

## 2020-07-21 MED ORDER — HYDROMORPHONE HCL 1 MG/ML IJ SOLN
0.5000 mg | INTRAMUSCULAR | Status: DC | PRN
  Administered 2020-07-21: 0.5 mg via INTRAVENOUS
  Administered 2020-07-22: 1 mg via INTRAVENOUS
  Administered 2020-07-22 (×2): 0.5 mg via INTRAVENOUS
  Filled 2020-07-21 (×4): qty 1

## 2020-07-21 MED ORDER — SODIUM CHLORIDE (PF) 0.9 % IJ SOLN
INTRAMUSCULAR | Status: AC
  Filled 2020-07-21: qty 20

## 2020-07-21 MED ORDER — PHENYLEPHRINE 40 MCG/ML (10ML) SYRINGE FOR IV PUSH (FOR BLOOD PRESSURE SUPPORT)
PREFILLED_SYRINGE | INTRAVENOUS | Status: AC
  Filled 2020-07-21: qty 10

## 2020-07-21 MED ORDER — HYDROCODONE-ACETAMINOPHEN 5-325 MG PO TABS
1.0000 | ORAL_TABLET | Freq: Four times a day (QID) | ORAL | 0 refills | Status: DC | PRN
Start: 2020-07-21 — End: 2020-09-22

## 2020-07-21 MED ORDER — DIPHENHYDRAMINE HCL 12.5 MG/5ML PO ELIX: 12.5000 mg | ORAL_SOLUTION | Freq: Four times a day (QID) | ORAL | Status: DC | PRN

## 2020-07-21 MED ORDER — FENTANYL CITRATE (PF) 250 MCG/5ML IJ SOLN
INTRAMUSCULAR | Status: AC
  Filled 2020-07-21: qty 5

## 2020-07-21 MED ORDER — ORAL CARE MOUTH RINSE: 15.0000 mL | Freq: Once | OROMUCOSAL | Status: AC

## 2020-07-21 MED ORDER — MAGNESIUM CITRATE PO SOLN
1.0000 | Freq: Once | ORAL | Status: DC
  Filled 2020-07-21: qty 296

## 2020-07-21 MED ORDER — DEXTROSE-NACL 5-0.45 % IV SOLN: INTRAVENOUS | Status: DC

## 2020-07-21 MED ORDER — EPHEDRINE SULFATE-NACL 50-0.9 MG/10ML-% IV SOSY
PREFILLED_SYRINGE | INTRAVENOUS | Status: DC | PRN
  Administered 2020-07-21: 10 mg via INTRAVENOUS

## 2020-07-21 MED ORDER — MIDAZOLAM HCL 2 MG/2ML IJ SOLN
INTRAMUSCULAR | Status: DC | PRN
  Administered 2020-07-21: 2 mg via INTRAVENOUS

## 2020-07-21 MED ORDER — SENNOSIDES-DOCUSATE SODIUM 8.6-50 MG PO TABS
2.0000 | ORAL_TABLET | Freq: Every day | ORAL | Status: DC
  Administered 2020-07-21 – 2020-07-22 (×2): 2 via ORAL
  Filled 2020-07-21 (×2): qty 2

## 2020-07-21 MED ORDER — ONDANSETRON HCL 4 MG/2ML IJ SOLN
INTRAMUSCULAR | Status: DC | PRN
  Administered 2020-07-21: 4 mg via INTRAVENOUS

## 2020-07-21 MED ORDER — DIPHENHYDRAMINE HCL 50 MG/ML IJ SOLN: 12.5000 mg | Freq: Four times a day (QID) | INTRAMUSCULAR | Status: DC | PRN

## 2020-07-21 MED ORDER — LIDOCAINE 2% (20 MG/ML) 5 ML SYRINGE
INTRAMUSCULAR | Status: DC | PRN
  Administered 2020-07-21: 80 mg via INTRAVENOUS

## 2020-07-21 MED ORDER — ONDANSETRON HCL 4 MG/2ML IJ SOLN
4.0000 mg | INTRAMUSCULAR | Status: DC | PRN
  Administered 2020-07-22 (×2): 4 mg via INTRAVENOUS
  Filled 2020-07-21 (×2): qty 2

## 2020-07-21 MED ORDER — DEXAMETHASONE SODIUM PHOSPHATE 10 MG/ML IJ SOLN
INTRAMUSCULAR | Status: AC
  Filled 2020-07-21: qty 1

## 2020-07-21 MED ORDER — ROCURONIUM BROMIDE 10 MG/ML (PF) SYRINGE
PREFILLED_SYRINGE | INTRAVENOUS | Status: AC
  Filled 2020-07-21: qty 10

## 2020-07-21 MED ORDER — CHLORHEXIDINE GLUCONATE 0.12 % MT SOLN
15.0000 mL | Freq: Once | OROMUCOSAL | Status: AC
  Administered 2020-07-21: 15 mL via OROMUCOSAL

## 2020-07-21 MED ORDER — BELLADONNA ALKALOIDS-OPIUM 16.2-60 MG RE SUPP: 1.0000 | Freq: Four times a day (QID) | RECTAL | Status: DC | PRN

## 2020-07-21 MED ORDER — PHENYLEPHRINE 40 MCG/ML (10ML) SYRINGE FOR IV PUSH (FOR BLOOD PRESSURE SUPPORT)
PREFILLED_SYRINGE | INTRAVENOUS | Status: DC | PRN
  Administered 2020-07-21 (×2): 120 ug via INTRAVENOUS

## 2020-07-21 MED ORDER — ONDANSETRON HCL 4 MG/2ML IJ SOLN
INTRAMUSCULAR | Status: AC
  Filled 2020-07-21: qty 2

## 2020-07-21 MED ORDER — STERILE WATER FOR INJECTION IJ SOLN
INTRAMUSCULAR | Status: DC | PRN
  Administered 2020-07-21: .4 mL

## 2020-07-21 MED ORDER — BUPIVACAINE LIPOSOME 1.3 % IJ SUSP
20.0000 mL | Freq: Once | INTRAMUSCULAR | Status: AC
  Administered 2020-07-21: 20 mL
  Filled 2020-07-21: qty 20

## 2020-07-21 MED ORDER — LACTATED RINGERS IR SOLN
Status: DC | PRN
  Administered 2020-07-21: 1000 mL

## 2020-07-21 MED ORDER — SUGAMMADEX SODIUM 200 MG/2ML IV SOLN
INTRAVENOUS | Status: DC | PRN
  Administered 2020-07-21: 270 mg via INTRAVENOUS

## 2020-07-21 MED ORDER — SODIUM CHLORIDE 0.9 % IV BOLUS
1000.0000 mL | Freq: Once | INTRAVENOUS | Status: AC
  Administered 2020-07-21: 1000 mL via INTRAVENOUS

## 2020-07-21 MED ORDER — FENTANYL CITRATE (PF) 100 MCG/2ML IJ SOLN
INTRAMUSCULAR | Status: AC
  Filled 2020-07-21: qty 2

## 2020-07-21 MED ORDER — ACETAMINOPHEN 500 MG PO TABS
1000.0000 mg | ORAL_TABLET | Freq: Four times a day (QID) | ORAL | Status: AC
  Administered 2020-07-21 – 2020-07-22 (×4): 1000 mg via ORAL
  Filled 2020-07-21 (×4): qty 2

## 2020-07-21 MED ORDER — PROPOFOL 10 MG/ML IV BOLUS
INTRAVENOUS | Status: DC | PRN
  Administered 2020-07-21: 180 mg via INTRAVENOUS

## 2020-07-21 MED ORDER — MIDAZOLAM HCL 2 MG/2ML IJ SOLN
INTRAMUSCULAR | Status: AC
  Filled 2020-07-21: qty 2

## 2020-07-21 MED ORDER — SODIUM CHLORIDE 0.9 % IV SOLN
500.0000 mL | Freq: Once | INTRAVENOUS | Status: AC
  Administered 2020-07-21: 500 mL via INTRAVENOUS

## 2020-07-21 MED ORDER — DEXAMETHASONE SODIUM PHOSPHATE 10 MG/ML IJ SOLN
INTRAMUSCULAR | Status: DC | PRN
  Administered 2020-07-21: 8 mg via INTRAVENOUS

## 2020-07-21 MED ORDER — STERILE WATER FOR IRRIGATION IR SOLN
Status: DC | PRN
  Administered 2020-07-21: 1000 mL

## 2020-07-21 MED ORDER — EPHEDRINE 5 MG/ML INJ
INTRAVENOUS | Status: AC
  Filled 2020-07-21: qty 10

## 2020-07-21 MED ORDER — LIDOCAINE 2% (20 MG/ML) 5 ML SYRINGE
INTRAMUSCULAR | Status: AC
  Filled 2020-07-21: qty 5

## 2020-07-21 MED ORDER — FENTANYL CITRATE (PF) 250 MCG/5ML IJ SOLN
INTRAMUSCULAR | Status: DC | PRN
Start: 2020-07-21 — End: 2020-07-21
  Administered 2020-07-21: 25 ug via INTRAVENOUS
  Administered 2020-07-21: 50 ug via INTRAVENOUS
  Administered 2020-07-21: 25 ug via INTRAVENOUS
  Administered 2020-07-21: 100 ug via INTRAVENOUS
  Administered 2020-07-21: 50 ug via INTRAVENOUS
  Administered 2020-07-21 (×2): 25 ug via INTRAVENOUS

## 2020-07-21 MED ORDER — SULFAMETHOXAZOLE-TRIMETHOPRIM 800-160 MG PO TABS: 1.0000 | ORAL_TABLET | Freq: Two times a day (BID) | ORAL | 0 refills | Status: DC

## 2020-07-21 MED ORDER — SODIUM CHLORIDE (PF) 0.9 % IJ SOLN
INTRAMUSCULAR | Status: DC | PRN
  Administered 2020-07-21: 20 mL

## 2020-07-21 MED ORDER — INDIGOTINDISULFONATE SODIUM 8 MG/ML IJ SOLN
INTRAMUSCULAR | Status: AC
  Filled 2020-07-21: qty 5

## 2020-07-21 MED ORDER — METOPROLOL SUCCINATE ER 25 MG PO TB24
25.0000 mg | ORAL_TABLET | Freq: Every day | ORAL | Status: DC
  Administered 2020-07-22 – 2020-07-23 (×2): 25 mg via ORAL
  Filled 2020-07-21 (×2): qty 1

## 2020-07-21 MED ORDER — ONDANSETRON HCL 4 MG/2ML IJ SOLN: 4.0000 mg | Freq: Once | INTRAMUSCULAR | Status: DC | PRN

## 2020-07-21 MED ORDER — ROCURONIUM BROMIDE 10 MG/ML (PF) SYRINGE
PREFILLED_SYRINGE | INTRAVENOUS | Status: DC | PRN
  Administered 2020-07-21 (×2): 10 mg via INTRAVENOUS
  Administered 2020-07-21: 70 mg via INTRAVENOUS
  Administered 2020-07-21: 30 mg via INTRAVENOUS
  Administered 2020-07-21 (×2): 10 mg via INTRAVENOUS
  Administered 2020-07-21: 20 mg via INTRAVENOUS
  Administered 2020-07-21: 10 mg via INTRAVENOUS

## 2020-07-21 MED ORDER — HYDROMORPHONE HCL 1 MG/ML IJ SOLN
INTRAMUSCULAR | Status: AC
  Filled 2020-07-21: qty 1

## 2020-07-21 MED ORDER — PROPOFOL 10 MG/ML IV BOLUS
INTRAVENOUS | Status: AC
  Filled 2020-07-21: qty 20

## 2020-07-21 MED ORDER — ALLOPURINOL 100 MG PO TABS
100.0000 mg | ORAL_TABLET | Freq: Two times a day (BID) | ORAL | Status: DC
  Administered 2020-07-21 – 2020-07-23 (×4): 100 mg via ORAL
  Filled 2020-07-21 (×4): qty 1

## 2020-07-21 SURGICAL SUPPLY — 83 items
APPLICATOR COTTON TIP 6 STRL (MISCELLANEOUS) ×3 IMPLANT
APPLICATOR COTTON TIP 6IN STRL (MISCELLANEOUS) ×4
BAG URO CATCHER STRL LF (MISCELLANEOUS) IMPLANT
CATH FOLEY 2WAY SLVR 18FR 30CC (CATHETERS) ×4 IMPLANT
CATH TIEMANN FOLEY 18FR 5CC (CATHETERS) ×4 IMPLANT
CHLORAPREP W/TINT 26 (MISCELLANEOUS) ×4 IMPLANT
CLIP VESOLOCK LG 6/CT PURPLE (CLIP) ×12 IMPLANT
CLOTH BEACON ORANGE TIMEOUT ST (SAFETY) IMPLANT
CNTNR URN SCR LID CUP LEK RST (MISCELLANEOUS) ×3 IMPLANT
CONT SPEC 4OZ STRL OR WHT (MISCELLANEOUS) ×4
COVER SURGICAL LIGHT HANDLE (MISCELLANEOUS) ×4 IMPLANT
COVER TIP SHEARS 8 DVNC (MISCELLANEOUS) ×3 IMPLANT
COVER TIP SHEARS 8MM DA VINCI (MISCELLANEOUS) ×4
COVER WAND RF STERILE (DRAPES) IMPLANT
CUTTER ECHEON FLEX ENDO 45 340 (ENDOMECHANICALS) ×4 IMPLANT
DECANTER SPIKE VIAL GLASS SM (MISCELLANEOUS) ×4 IMPLANT
DERMABOND ADVANCED (GAUZE/BANDAGES/DRESSINGS) ×1
DERMABOND ADVANCED .7 DNX12 (GAUZE/BANDAGES/DRESSINGS) ×3 IMPLANT
DRAIN CHANNEL RND F F (WOUND CARE) IMPLANT
DRAPE ARM DVNC X/XI (DISPOSABLE) ×12 IMPLANT
DRAPE COLUMN DVNC XI (DISPOSABLE) ×3 IMPLANT
DRAPE DA VINCI XI ARM (DISPOSABLE) ×12
DRAPE DA VINCI XI COLUMN (DISPOSABLE) ×4
DRAPE SURG IRRIG POUCH 19X23 (DRAPES) ×4 IMPLANT
DRSG TEGADERM 4X4.75 (GAUZE/BANDAGES/DRESSINGS) ×4 IMPLANT
ELECT REM PT RETURN 15FT ADLT (MISCELLANEOUS) ×4 IMPLANT
GAUZE 4X4 16PLY RFD (DISPOSABLE) IMPLANT
GAUZE SPONGE 2X2 8PLY STRL LF (GAUZE/BANDAGES/DRESSINGS) IMPLANT
GLOVE BIO SURGEON STRL SZ 6.5 (GLOVE) ×4 IMPLANT
GLOVE BIOGEL M STRL SZ7.5 (GLOVE) ×8 IMPLANT
GLOVE BIOGEL PI IND STRL 7.5 (GLOVE) ×3 IMPLANT
GLOVE BIOGEL PI INDICATOR 7.5 (GLOVE) ×1
GOWN STRL REUS W/TWL LRG LVL3 (GOWN DISPOSABLE) ×12 IMPLANT
HEMOSTAT SURGICEL 2X14 (HEMOSTASIS) ×4 IMPLANT
HOLDER FOLEY CATH W/STRAP (MISCELLANEOUS) ×4 IMPLANT
IRRIG SUCT STRYKERFLOW 2 WTIP (MISCELLANEOUS) ×4
IRRIGATION SUCT STRKRFLW 2 WTP (MISCELLANEOUS) ×3 IMPLANT
IV LACTATED RINGERS 1000ML (IV SOLUTION) ×4 IMPLANT
KIT PROCEDURE DA VINCI SI (MISCELLANEOUS) ×4
KIT PROCEDURE DVNC SI (MISCELLANEOUS) ×3 IMPLANT
KIT TURNOVER KIT A (KITS) IMPLANT
LASER FIB FLEXIVA PULSE ID 365 (Laser) IMPLANT
LASER FIB FLEXIVA PULSE ID 550 (Laser) IMPLANT
LASER FIB FLEXIVA PULSE ID 910 (Laser) IMPLANT
MANIFOLD NEPTUNE II (INSTRUMENTS) ×4 IMPLANT
NEEDLE INSUFFLATION 14GA 120MM (NEEDLE) ×4 IMPLANT
NEEDLE SPNL 22GX7 QUINCKE BK (NEEDLE) ×4 IMPLANT
PACK CYSTO (CUSTOM PROCEDURE TRAY) IMPLANT
PACK ROBOT UROLOGY CUSTOM (CUSTOM PROCEDURE TRAY) ×4 IMPLANT
PAD POSITIONING PINK XL (MISCELLANEOUS) ×4 IMPLANT
PENCIL SMOKE EVACUATOR (MISCELLANEOUS) IMPLANT
PORT ACCESS TROCAR AIRSEAL 12 (TROCAR) ×3 IMPLANT
PORT ACCESS TROCAR AIRSEAL 5M (TROCAR) ×1
SEAL CANN UNIV 5-8 DVNC XI (MISCELLANEOUS) ×12 IMPLANT
SEAL XI 5MM-8MM UNIVERSAL (MISCELLANEOUS) ×16
SET TRI-LUMEN FLTR TB AIRSEAL (TUBING) ×4 IMPLANT
SOLUTION ELECTROLUBE (MISCELLANEOUS) ×4 IMPLANT
SPONGE GAUZE 2X2 STER 10/PKG (GAUZE/BANDAGES/DRESSINGS)
SPONGE LAP 4X18 RFD (DISPOSABLE) ×4 IMPLANT
STAPLE RELOAD 45 GRN (STAPLE) ×3 IMPLANT
STAPLE RELOAD 45MM GREEN (STAPLE) ×4
SUT ETHIBOND 0 (SUTURE) ×16 IMPLANT
SUT ETHILON 3 0 PS 1 (SUTURE) ×4 IMPLANT
SUT MNCRL AB 4-0 PS2 18 (SUTURE) ×8 IMPLANT
SUT PDS AB 1 CT1 27 (SUTURE) ×12 IMPLANT
SUT VIC AB 2-0 SH 27 (SUTURE) ×4
SUT VIC AB 2-0 SH 27X BRD (SUTURE) ×3 IMPLANT
SUT VIC AB 2-0 UR6 27 (SUTURE) ×4 IMPLANT
SUT VIC AB 3-0 SH 27 (SUTURE) ×4
SUT VIC AB 3-0 SH 27XBRD (SUTURE) ×3 IMPLANT
SUT VICRYL 0 UR6 27IN ABS (SUTURE) ×4 IMPLANT
SUT VLOC BARB 180 ABS3/0GR12 (SUTURE) ×12
SUTURE VLOC BRB 180 ABS3/0GR12 (SUTURE) ×9 IMPLANT
SYR 27GX1/2 1ML LL SAFETY (SYRINGE) ×4 IMPLANT
SYR TOOMEY IRRIG 70ML (MISCELLANEOUS)
SYRINGE TOOMEY IRRIG 70ML (MISCELLANEOUS) IMPLANT
TOWEL OR NON WOVEN STRL DISP B (DISPOSABLE) ×4 IMPLANT
TRACTIP FLEXIVA PULS ID 200XHI (Laser) IMPLANT
TRACTIP FLEXIVA PULSE ID 200 (Laser)
TROCAR XCEL NON-BLD 5MMX100MML (ENDOMECHANICALS) IMPLANT
TUBING CONNECTING 10 (TUBING) IMPLANT
TUBING UROLOGY SET (TUBING) IMPLANT
WATER STERILE IRR 1000ML POUR (IV SOLUTION) ×4 IMPLANT

## 2020-07-21 NOTE — H&P (Signed)
[name] [name] is an 74 y.o. male.    Chief Complaint: Pre-OP Prostatectomy   HPI:   1 - Recurrent Nephrolithiasis -  Pre 2014 - medical therapy for small stones x2  04/2013 - rt ureteroscopy and tethered stent for 56mm proximal ureteral stone / pre-stented due to relative narrowing over ilacs.   Recent Surveillance:  01/2019 - KUB - small bladder stone, ?50mm Rt pap dip calcification Urate 6.1  03/2020 - KUB, RUS - small bladder stone (1cm) , ?74mm Rt pap dip calcification    2 - Metabolic Stone Disease / Hyperuricemia / Aciduria / hyperoxaluria-  Eval 2014: BMP, Urate, PTH - elevated Uric Acid (7.9), low urine pH (5.0); Composition - CaOx; 24hr urines - aciduria, modest elevated oxalate ==> Allopurinol 100 BID    3 - Rt Renal Cyst - 1.9 cm non-complex cyst incidetnal by renal US 09/2013.   4 - High Risk Prostate Cancer - Gleason 3+4=7 (grade 2) cancer 25% RLA and gleason 6 (grade 1) 20% LMB by TRUS 02/2018 on eval rising PSA to 7.68 at age 58.TRUS 164mL, no median lobe. Given his age, he has opted for initial surveillance.   Recent Course:  08/2018 - PSA 7.51 / DRE >80gm smooth.  01/2019 - PSA 8.91 / MRI 2.49m L mid and apical nodule P5; 08/2019 PSA 8.74  02/2020 - PSA 15.7 / Fusion BX 135 mL 3 cores up to 20% Grade 4 cancer from Lt mid/apical ROI, CT, BS clinicaly localized + 1cm bladder stone.   5 - Erectile Dysfunction - slowly increasing bother from difficultly achieving and maintaining erection. Libido preserved. Unaided able to penetrate but hard to maintatin to climax. Now on sildenafil at 60mg  with satisfaction.   6- Enlarged Prostate with Urinary Frequency / Nocturia - slowly increasing bother from mostly irritative sympotms. TRUS 159mL. Starting Finasteride 02/2019, but stopped after 3 mos as no percieved benefits. STarting QHS oxybutynin 08/2019.   PMH sig for gout, OA, ortho surgery, lap chole. No CV disease. No strong blood thinners. His PCP is Jerilynn Birkenhead with [facility].     Today [name] is seen to proceed with robotic radical prostatectomy + cystolithalopexy.     Past Medical History:  Diagnosis Date  . Arthritis   . Frequency of urination   . Hematuria   . History of gout   . History of kidney stones   . Hypertension   . Pneumonia 1981   History of  . Prostate cancer ([place])   . Right ureteral stone   . Urgency of urination     Past Surgical History:  Procedure Laterality Date  . ACHILLES TENDON SURGERY Left   . CHOLECYSTECTOMY    . CYST EXCISION     finger  . CYSTO/  RETROGRADE PYELOGRAM/ RIGHT URETERAL PLACEMENT  04-04-2013  . CYSTOSCOPY WITH RETROGRADE PYELOGRAM, URETEROSCOPY AND STENT PLACEMENT Right 04/04/2013   Procedure: CYSTOSCOPY WITH RIGHT RETROGRADE PYELOGRAM, AND RIGHT STENT PLACEMENT, DIAGNOSTIC RIGHT URETEROSCOPY;  Surgeon: Alexis Frock, MD;  [facility];  Service: Urology;  Laterality: Right;  . CYSTOSCOPY WITH URETEROSCOPY AND STENT PLACEMENT Right 05/07/2013   Procedure: CYSTOSCOPY WITH URETEROSCOPY AND STENT PLACEMENT;  Surgeon: Alexis Frock, MD;  Location: [facility];  Service: Urology;  Laterality: Right;  . HOLMIUM LASER APPLICATION Right 0/86/7619   Procedure: HOLMIUM LASER APPLICATION;  Surgeon: Alexis Frock, MD;  Location: [facility];  Service: Urology;  Laterality: Right;  . KNEE ARTHROSCOPY W/ MENISCECTOMY Right   . LAPAROSCOPIC  CHOLECYSTECTOMY  2003  . TOTAL KNEE ARTHROPLASTY  10/08/2012   Procedure: TOTAL KNEE ARTHROPLASTY;  Surgeon: Sydnee Cabal, MD;  [facility];  Service: Orthopedics;  Laterality: Right;    No family history on file. Social History:  reports that he has never smoked. He has quit using smokeless tobacco.  His smokeless tobacco use included chew. He reports current alcohol use. He reports that he does not use drugs.  Allergies: No Known Allergies  No medications prior to admission.    No results found for this or any previous visit (from the  past 48 hour(s)). No results found.  Review of Systems  There were no vitals taken for this visit. Physical Exam   Assessment/Plan  Proceed as planned with prostatecomy / node dissection / cystolithalopexy. Risks, benefits, alternatives, expected peri-op course discussed previously and reiterated today.   Alexis Frock, MD 07/21/2020, 8:37 AM

## 2020-07-21 NOTE — Discharge Instructions (Signed)

## 2020-07-21 NOTE — Transfer of Care (Signed)
Immediate Anesthesia Transfer of Care Note  Patient: [name] [name]  Procedure(s) Performed: XI ROBOTIC ASSISTED LAPAROSCOPIC RADICAL PROSTATECTOMY, BILATERAL INGUINAL HERNIA REPAIR (N/A Abdomen) LYMPHADENECTOMY (Bilateral Abdomen)  Patient Location: PACU  Anesthesia Type:General  Level of Consciousness: awake  Airway & Oxygen Therapy: Patient Spontanous Breathing and Patient connected to face mask oxygen  Post-op Assessment: Report given to RN, Post -op Vital signs reviewed and stable and Patient moving all extremities X 4  Post vital signs: Reviewed and stable  Last Vitals:  Vitals Value Taken Time  BP    Temp    Pulse 70 07/21/20 1604  Resp    SpO2 93 % 07/21/20 1604  Vitals shown include unvalidated device data.  Last Pain:  Vitals:   07/21/20 1044  TempSrc:   PainSc: 0-No pain      Patients Stated Pain Goal: 4 (88/11/03 1594)  Complications: No complications documented.

## 2020-07-21 NOTE — Anesthesia Postprocedure Evaluation (Signed)
Anesthesia Post Note  Patient: [name] [name]  Procedure(s) Performed: XI ROBOTIC ASSISTED LAPAROSCOPIC RADICAL PROSTATECTOMY, BILATERAL INGUINAL HERNIA REPAIR (N/A Abdomen) LYMPHADENECTOMY (Bilateral Abdomen)     Patient location during evaluation: PACU Anesthesia Type: General Level of consciousness: awake and alert Pain management: pain level controlled Vital Signs Assessment: post-procedure vital signs reviewed and stable Respiratory status: spontaneous breathing, nonlabored ventilation, respiratory function stable and patient connected to nasal cannula oxygen Cardiovascular status: blood pressure returned to baseline and stable Postop Assessment: no apparent nausea or vomiting Anesthetic complications: no   No complications documented.  Last Vitals:  Vitals:   07/21/20 1829 07/21/20 1830  BP: (!) 84/64 (!) 105/57  Pulse:    Resp:    Temp:    SpO2:      Last Pain:  Vitals:   07/21/20 1730  TempSrc:   PainSc: 5                  Asjah Rauda P Marigene Erler

## 2020-07-21 NOTE — Brief Op Note (Signed)
07/21/2020  5:08 PM  PATIENT:  [name] [name]  74 y.o. male  PRE-OPERATIVE DIAGNOSIS:  PROSTATE CANCER, BLADDER STONE  POST-OPERATIVE DIAGNOSIS:  PROSTATE CANCER, BLADDER STONE, BILATERAL inguinal hernias  PROCEDURE:  Procedure(s) with comments: XI ROBOTIC ASSISTED LAPAROSCOPIC RADICAL PROSTATECTOMY, BILATERAL INGUINAL HERNIA REPAIR (N/A) - 3 HRS LYMPHADENECTOMY (Bilateral)  SURGEON:  Surgeon(s) and Role:    Alexis Frock, MD - Primary  PHYSICIAN ASSISTANT:   ASSISTANTS: Clemetine Marker PA   ANESTHESIA:   general  EBL:  900 mL   BLOOD ADMINISTERED:none  DRAINS: 1 -JP to bulb; 2 - Foley to gravity   LOCAL MEDICATIONS USED:  MARCAINE     SPECIMEN:  Source of Specimen:  1 - pelvic lymph nodes; 2- perirostatic fat; 3 - prostatectomy  DISPOSITION OF SPECIMEN:  PATHOLOGY  COUNTS:  YES  TOURNIQUET:  * No tourniquets in log *  DICTATION: .Other Dictation: Dictation Number  [id number]  PLAN OF CARE: Admit for overnight observation  PATIENT DISPOSITION:  PACU - hemodynamically stable.   Delay start of Pharmacological VTE agent (>24hrs) due to surgical blood loss or risk of bleeding: yes

## 2020-07-21 NOTE — Progress Notes (Signed)
1830: Pt arrived to unit with BP 97/77, HR 74, pt lethargic but talking to staff and AxOx4. BP rechecked and dropped to 84/68. MD Manny notified. Verbal orders given to increase fluid rate to 1108ml/h. Rapid Designer, multimedia notified.  1845: BP dropped to 79/61, Pt remains lethargic and slow to respond at this point. Hypotention standing orders carried out per unit protocol.     1856: BP improved to 108/72, report given to oncoming RN. Pt in stable condition at shift change.

## 2020-07-21 NOTE — Anesthesia Procedure Notes (Signed)
Procedure Name: Intubation Date/Time: 07/21/2020 12:11 PM Performed by: Niel Hummer, CRNA Pre-anesthesia Checklist: Patient identified, Emergency Drugs available, Suction available and Patient being monitored Patient Re-evaluated:Patient Re-evaluated prior to induction Oxygen Delivery Method: Circle system utilized Preoxygenation: Pre-oxygenation with 100% oxygen Induction Type: IV induction Ventilation: Mask ventilation without difficulty and Oral airway inserted - appropriate to patient size Laryngoscope Size: Mac and 4 Grade View: Grade I Tube type: Oral Tube size: 7.5 mm Number of attempts: 1 Airway Equipment and Method: Stylet Placement Confirmation: ETT inserted through vocal cords under direct vision,  positive ETCO2 and breath sounds checked- equal and bilateral Secured at: 23 cm Tube secured with: Tape Dental Injury: Teeth and Oropharynx as per pre-operative assessment

## 2020-07-22 ENCOUNTER — Encounter (HOSPITAL_COMMUNITY): Payer: Self-pay | Admitting: Urology

## 2020-07-22 DIAGNOSIS — C61 Malignant neoplasm of prostate: Secondary | ICD-10-CM | POA: Diagnosis not present

## 2020-07-22 LAB — BASIC METABOLIC PANEL
Anion gap: 11 (ref 5–15)
BUN: 23 mg/dL (ref 8–23)
CO2: 21 mmol/L — ABNORMAL LOW (ref 22–32)
Calcium: 8.1 mg/dL — ABNORMAL LOW (ref 8.9–10.3)
Chloride: 102 mmol/L (ref 98–111)
Creatinine, Ser: 1.77 mg/dL — ABNORMAL HIGH (ref 0.61–1.24)
GFR, Estimated: 40 mL/min — ABNORMAL LOW (ref >60)
Glucose, Bld: 284 mg/dL — ABNORMAL HIGH (ref 70–99)
Potassium: 4.8 mmol/L (ref 3.5–5.1)
Sodium: 134 mmol/L — ABNORMAL LOW (ref 135–145)

## 2020-07-22 LAB — CREATININE, FLUID (PLEURAL, PERITONEAL, JP DRAINAGE)
Creat, Fluid: 1.5 mg/dL
Creat, Fluid: 1.7 mg/dL

## 2020-07-22 LAB — HEMOGLOBIN AND HEMATOCRIT, BLOOD
HCT: 42.8 % (ref 39.0–52.0)
Hemoglobin: 13.7 g/dL (ref 13.0–17.0)

## 2020-07-22 MED ORDER — CHLORHEXIDINE GLUCONATE CLOTH 2 % EX PADS
6.0000 | MEDICATED_PAD | Freq: Every day | CUTANEOUS | Status: DC
  Administered 2020-07-22: 6 via TOPICAL

## 2020-07-22 MED ORDER — BISACODYL 10 MG RE SUPP
10.0000 mg | Freq: Once | RECTAL | Status: AC
  Administered 2020-07-22: 10 mg via RECTAL
  Filled 2020-07-22: qty 1

## 2020-07-22 NOTE — Plan of Care (Signed)
Pt has no s/s of sepsis at this time. Pt took prn medication for nausea and pain (see MARs).  Problem: Education: Goal: Knowledge of General Education information will improve Description: Including pain rating scale, medication(s)/side effects and non-pharmacologic comfort measures Outcome: Progressing   Problem: Health Behavior/Discharge Planning: Goal: Ability to manage health-related needs will improve Outcome: Progressing   Problem: Clinical Measurements: Goal: Ability to maintain clinical measurements within normal limits will improve Outcome: Progressing Goal: Will remain free from infection Outcome: Progressing Goal: Diagnostic test results will improve Outcome: Progressing Goal: Respiratory complications will improve Outcome: Progressing Goal: Cardiovascular complication will be avoided Outcome: Progressing   Problem: Activity: Goal: Risk for activity intolerance will decrease Outcome: Progressing   Problem: Nutrition: Goal: Adequate nutrition will be maintained Outcome: Progressing   Problem: Coping: Goal: Level of anxiety will decrease Outcome: Progressing   Problem: Elimination: Goal: Will not experience complications related to bowel motility Outcome: Progressing Goal: Will not experience complications related to urinary retention Outcome: Progressing   Problem: Pain Managment: Goal: General experience of comfort will improve Outcome: Progressing   Problem: Safety: Goal: Ability to remain free from injury will improve Outcome: Progressing   Problem: Skin Integrity: Goal: Risk for impaired skin integrity will decrease Outcome: Progressing   Problem: Education: Goal: Knowledge of the procedure and recovery process will improve Outcome: Progressing   Problem: Bowel/Gastric: Goal: Gastrointestinal status for postoperative course will improve Outcome: Progressing   Problem: Pain Management: Goal: General experience of comfort will improve Outcome:  Progressing   Problem: Skin Integrity: Goal: Demonstration of wound healing without infection will improve Outcome: Progressing   Problem: Urinary Elimination: Goal: Ability to avoid or minimize complications of infection will improve Outcome: Progressing Goal: Ability to achieve and maintain urine output will improve Outcome: Progressing Goal: Home care management will improve Outcome: Progressing

## 2020-07-22 NOTE — Progress Notes (Signed)
1 Day Post-Op   Subjective/Chief Complaint:  1 - High Risk Prostate Cancer - s/p robotic prostatecotmy with node dissection + bialteral inguinal hernia repair11/10/21. Path pending.   Today "[name] [name]" is stable. Some mild low BP overnight that responded to fluid. Hgb 13.7. Ambulated already this AM.    Objective: Vital signs in last 24 hours: Temp:  [97.3 F (36.3 C)-98.5 F (36.9 C)] 98.1 F (36.7 C) (11/11 0445) Pulse Rate:  [48-91] 87 (11/11 0502) Resp:  [12-22] 18 (11/11 0445) BP: (79-153)/(51-104) 123/86 (11/11 0502) SpO2:  [91 %-100 %] 96 % (11/11 0502) Weight:  [132.2 kg] 132.2 kg (11/10 1044)    Intake/Output from previous day: 11/10 0701 - 11/11 0700 In: 3375.4 [I.V.:3325.4; IV Piggyback:50] Out: 1750 [Urine:500; Drains:350; Blood:900] Intake/Output this shift: Total I/O In: 925.4 [I.V.:925.4] Out: 750 [Urine:500; Drains:250]  General appearance: alert and cooperative Eyes: negative Nose: Nares normal. Septum midline. Mucosa normal. No drainage or sinus tenderness. Throat: lips, mucosa, and tongue normal; teeth and gums normal Neck: supple, symmetrical, trachea midline Back: symmetric, no curvature. ROM normal. No CVA tenderness. Resp: Non-labored on minimal [place] O2. Cardio: Nl rate GI: soft, non-tender; bowel sounds normal; no masses,  no organomegaly and Recnet port / extraction sites c/d/i. JP wth non-foul serosanguinous fluid.  Male genitalia: normal, Foley wtih non-foul tea colored urine.  Extremities: extremities normal, atraumatic, no cyanosis or edema Pulses: 2+ and symmetric Neurologic: Grossly normal  Lab Results:  Recent Labs    07/21/20 1619 07/22/20 0519  HGB 15.9 13.7  HCT 48.5 42.8   BMET Recent Labs    07/22/20 0519  NA 134*  K 4.8  CL 102  CO2 21*  GLUCOSE 284*  BUN 23  CREATININE 1.77*  CALCIUM 8.1*   PT/INR No results for input(s): LABPROT, INR in the last 72 hours. ABG No results for input(s): PHART, HCO3 in the last 72  hours.  Invalid input(s): PCO2, PO2  Studies/Results: No results found.  Anti-infectives: Anti-infectives (From admission, onward)   Start     Dose/Rate Route Frequency Ordered Stop   07/21/20 0600  ceFAZolin (ANCEF) 3 g in dextrose 5 % 50 mL IVPB        3 g 100 mL/hr over 30 Minutes Intravenous  Once 07/20/20 0656 07/21/20 1226   07/21/20 0000  sulfamethoxazole-trimethoprim (BACTRIM DS) 800-160 MG tablet        1 tablet Oral 2 times daily 07/21/20 1558        Assessment/Plan: Doing well POD 1. Saline lock, ambulate, adv diet, Check JP Cr. Likely DC today PM v. Tomorrow based on currnet progress.   Alexis Frock 07/22/2020

## 2020-07-22 NOTE — Progress Notes (Signed)
1 Day Post-Op Subjective: Afternoon rounds: pt ambulated a short distance this morning but felt dizzy and has not ambulated since.  JP Cr not sent yet. VSS.  He is using the IS.  No UO recorded since this am and no JP output recorded since last night. No flatus or BM. Tolerating a regular diet but states "I'm not hungry". Pt states he has numbness/tingling in his right 1st, 2nd, and 3rd digits.  No pain.  He also c/o right shoulder pain.   Objective: Vital signs in last 24 hours: Temp:  [97.3 F (36.3 C)-98.8 F (37.1 C)] 98.8 F (37.1 C) (11/11 1353) Pulse Rate:  [48-98] 81 (11/11 1353) Resp:  [12-22] 16 (11/11 1353) BP: (79-133)/(51-89) 129/87 (11/11 1353) SpO2:  [91 %-100 %] 94 % (11/11 1353)  Intake/Output from previous day: 11/10 0701 - 11/11 0700 In: 3375.4 [I.V.:3325.4; IV Piggyback:50] Out: 1750 [Urine:500; Drains:350; Blood:900] Intake/Output this shift: Total I/O In: -  Out: 391 [Urine:325; Drains:66]  Physical Exam:  General:alert, cooperative and no distress GI: soft but mildly distended; minimal tenderness Incisions: C/D/I Right hand with good perfusion; 2+ radial pulse; cool to touch; motor/sensation intact Foley: 150cc dark bloody drainage in bag; dark bloody drainage in tubing JP: 75 cc serosang fluid    Foley hand irrigated with approx 100cc sterile saline.  Dark return with several small clots.  Irrigated to clear.   Lab Results: Recent Labs    07/21/20 1619 07/22/20 0519  HGB 15.9 13.7  HCT 48.5 42.8   BMET Recent Labs    07/22/20 0519  NA 134*  K 4.8  CL 102  CO2 21*  GLUCOSE 284*  BUN 23  CREATININE 1.77*  CALCIUM 8.1*   No results for input(s): LABPT, INR in the last 72 hours. No results for input(s): LABURIN in the last 72 hours. Results for orders placed or performed during the hospital encounter of 07/17/20  SARS CORONAVIRUS 2 (TAT 6-24 HRS) Nasopharyngeal Nasopharyngeal Swab     Status: None   Collection Time: 07/17/20  1:49 PM    Specimen: Nasopharyngeal Swab  Result Value Ref Range Status   SARS Coronavirus 2 NEGATIVE NEGATIVE Final    Comment: (NOTE) SARS-CoV-2 target nucleic acids are NOT DETECTED.  The SARS-CoV-2 RNA is generally detectable in upper and lower respiratory specimens during the acute phase of infection. Negative results do not preclude SARS-CoV-2 infection, do not rule out co-infections with other pathogens, and should not be used as the sole basis for treatment or other patient management decisions. Negative results must be combined with clinical observations, patient history, and epidemiological information. The expected result is Negative.  Fact Sheet for Patients: SugarRoll.be  Fact Sheet for Healthcare Providers: https://www.woods-mathews.com/  This test is not yet approved or cleared by the [place] FDA and  has been authorized for detection and/or diagnosis of SARS-CoV-2 by FDA under an Emergency Use Authorization (EUA). This EUA will remain  in effect (meaning this test can be used) for the duration of the COVID-19 declaration under Se ction 564(b)(1) of the Act, 21 U.S.C. section 360bbb-3(b)(1), unless the authorization is terminated or revoked sooner.  Performed at [facility], [place] [address], [place], [place] [address]     Studies/Results: No results found.  Assessment/Plan: 1 Day Post-Op Procedure(s) (LRB): XI ROBOTIC ASSISTED LAPAROSCOPIC RADICAL PROSTATECTOMY, BILATERAL INGUINAL HERNIA REPAIR (N/A) LYMPHADENECTOMY (Bilateral)  Stable  Monitor UO.  Foley irrigated to clear and draining well. Continue IVF and encourage PO fluid intake.  Ambulate q 2.  Dizziness improved per pt.  BP stable. Stop IV Dilaudid.  F/U JP Cr.  Leave JP in place  Finger tingling due to positioning during procedure.  Will improve.  Shoulder pain likely due to gas and positioning during procedure.  Will resolve.   Dulcolax supp to  encourage bowel function. Instructed pt to be slow with diet.   Cont IS  Plan for possible d/c tomorrow am   LOS: 0 days   Debbrah Alar 07/22/2020, 2:45 PM

## 2020-07-22 NOTE — Op Note (Addendum)
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  PROCEDURES: 1.  Robotic-assisted laparoscopic radical prostatectomy with bilateral pelvic lymph node dissection. 2.  Cystolitholapaxy. 3.  Bilateral inguinal hernia repair, laparoscopic.  ESTIMATED BLOOD LOSS:  500 mL.  COMPLICATIONS:  None.  SPECIMENS: 1.  Periprostatic fat. 2.  Right external iliac lymph nodes. 3.  Right obturator lymph nodes. 4.  Left external iliac lymph nodes. 5.  Left obturator lymph nodes, sentinel. 6.  Radical prostatectomy. 7.  Bladder stone (given the patient).  ASSISTANT:  Bari Mantis, PA  FINDINGS: 1.  Large bilateral inguinal hernias, more prominent after developing the space of Retzius. 2.  Complete resolution of all hernia defects following bilateral repair. 3.  Very large bilobar prostatic hypertrophy as anticipated. 4.  Approximately 1.5 cm Jackstone type bladder stone.  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  He has been on surveillance for this for a number of years.  He was found on surveillance biopsy to have significant disease  progression now to high-risk disease.  He underwent staging imaging that was clinically localized.  His prostate volume is 130 grams.  Given his very large prostate volume and progressive disease that remains clinically localized, options were discussed  for management including continued surveillance versus ablative therapies versus surgery, and given the very large gland size, it was felt that surgery would make sense for long term.  He wished to proceed.  He also has a small bladder stone to be  addressed  concomitantly.  Informed consent was obtained and placed in medical record.  DESCRIPTION OF PROCEDURE:  Patient being [name] [name], procedure being radical prostatectomy with cystolitholapaxy was confirmed.  Procedure timeout was performed.  Intravenous antibiotics were administered.  General endotracheal anesthesia was  induced.  The patient was placed into a low lithotomy position.  Sterile field was created, prepped and draped base of the penis, perineum, and proximal thighs using iodine and his infra-xiphoid abdomen using chlorhexidine gluconate.   After which, he  was further fastened to operative table using 3-inch tape over foam padding across supraxiphoid chest.  A test of steep Trendelenburg positioning was performed and found to be suitably positioned.  Foley catheter was placed free to straight drain.  Next,  a high-flow, low-pressure pneumoperitoneum was obtained with Veress technique in the supraumbilical midline having passed the aspiration and drop test.  An 8 mm robotic camera port was then placed in same location.  Laparoscopic examination of  peritoneal cavity revealed no significant adhesions, no visceral injury.  Distal ports were placed as follows:  Right paramedian 8 mm robotic port, right far lateral 12 mm AirSeal assist port, right paramedian 5 mm suction port, left paramedian 8 mm  robotic port, left far lateral 8 mm robotic port.  Robot was docked and passed electronic checks.  Initial attention was directed at development of the space of Retzius.  Incision was made lateral to right medial umbilical ligament from the midline  towards the area of the internal ring coursing along the iliac vessels towards the area of the right ureter, which was positively identified.  The right vas deferens was encountered and ligated and used as a medial bucket handle as the right bladder wall  swept away  from the pelvic sidewall towards the area of the endopelvic fascia on the right side.  A  mirror image dissection was performed on the left side, and after having developed the space of Retzius, it was obvious that there were bilateral  inguinal hernias that were fairly large diameter of the fascial defect approximately 3 cm.  Given the large size of these and the fact that no intraperitoneal contents would have access due to hernias, it was felt that the repair was warranted.  This is  to be addressed later.  Next, the anterior base of the prostate, which was defatted to better visualization of the bladder neck.  This set aside labeled as periprosthetic fat.  Next, 0.2 mL of indocyanine green dye was injected in each lobe of the  prostate using a percutaneously placed robotically guided spinal needle with intervening dye suctioning, resulted in excellent parenchymal uptake of the prostate.  Next, the endopelvic fascia was swept away from the lateral aspect of the prostate and  base to apex orientation to expose the dorsal venous complex.  It was controlled using a green load stapler, which resulted in excellent hemostatic control of the dorsal venous complex without membranous urethral injury.  It had been approximately 10  minutes post-dye injection, and the pelvis was inspected under near infrared fluorescence light.  Sentinel lymphangiography revealed excellent parenchymal uptake of the prostate with several lymphatic channels seen coursing towards the pelvic lymph node fields bilaterally.  There was a single area of sentinel lymph node within the left obturator  group.  As such, template lymphadenectomy was performed on the right side.  First, the right external iliac group with boundaries being right external iliac artery, vein, pelvic sidewall, iliac bifurcation.  Lymphostasis was achieved with cold clips,  labeled right external iliac lymph nodes.  Next, the right obturator group was dissected free with the boundaries being right external iliac vein, pelvic sidewall, obturator nerve.   Lymphostasis was achieved with cold clips, set aside, labeled right  obturator lymph nodes.  The right obturator nerve was inspected following maneuvers and found to be uninjured.  A mirror image lymphadenectomy was performed on the left side, left external iliac, left obturator group respectively.  Again, there was a  single dominant, large sentinel lymph node within left obturator group that had been noted as such.  The left obturator group was inspected following maneuvers and found to be uninjured.  Attention was directed at bladder neck dissection.  Given the  patient's very large prostate, bilobar hypertrophy, a lateral release was performed on each side to better denote the bladder neck and membranous urethral caliber.  The bladder neck was separated from the prostate in anterior posterior direction, keeping  what appeared to be a rim of circular muscle fibers with each plane of dissection.  I was quite happy with the caliber of bladder neck despite the large size of the prostate using this technique.  Posterior dissection was performed by incising  approximately 7 mm inferior posterior to the posterior lip of the prostate entering the plane of Denonvilliers.  This plane was somewhat desmoplastic likely due to his multiple prior biopsies.  Bilateral vas deferens were dissected for approximately 4  cm, ligated and placed on gentle superior traction.  Bilateral seminal vesicles were dissected to their tips and placed on gentle superior traction.  Dissection proceeded inferiorly towards the area of the apex of the prostate.  This exposed the vascular  pedicles, which were controlled using sequential clipping technique  using a purposeful wide dissection giving his high-risk disease.  Final apical dissection was performed in the anterior plane placing the prostate on gentle superior traction and  transecting the membranous urethra coldly, which was then placed into an EndoCatch bag for later retrieval.   Digital rectal exam was then performed using indicator glove under laparoscopic vision.  No evidence of rectal violation was noted.  The bladder  neck was once again visualized, and through the bladder neck, the small Jackstone was clearly seen.  This was grasped with robotic graspers and also placed into the same laparoscopic retrieval bag as the prostate, thus, performing cystolitholapaxy.   Posterior dissection was performed using a single 3-0 V-Loc suture reapproximating the posterior urethral plate to the posterior bladder neck, bringing the structures into tension free apposition.  Next, mucosa-to-mucosa anastomosis was performed using  double arm 3-0 V-Loc suture from the 6 o'clock to 12 o'clock position, which resulted in excellent mucosal apposition of the bladder neck and membranous urethra, and a Foley catheter was placed per urethra, which irrigated quantitatively.  Sponge, needle  counts were correct.  Hemostasis was excellent.  The area of inguinal hernias was once again inspected, and again, these were fairly large caliber and clearly felt to be a risk of future bowel obstruction.  As such, 0 Ethibond was used in a  figure-of-eight fashion x2 each side to reapproximate the inguinal fascia defect in the midline above the pubic symphysis.  This resulted in resolution of the hernia defect bilaterally.  A closed suction drain was then brought the previous left lateral  most robotic port site near the peritoneal cavity.  Robot was undocked.  Specimen was retrieved by extending the previous camera port site for a total distance of approximately 4 cm, removing the prostatectomy specimen setting aside for pathology.   Extraction site was closed with fascia using figure-of-eight PDS x4 followed by reapproximation of Scarpa's with running Vicryl.  All incision sites were infiltrated with dilute lipolyzed Marcaine and closed at the level skin using subcuticular Monocryl  by Dermabond.  The procedure was  then terminated.  The patient tolerated the procedure well.  No immediate complications.  The patient was taken to postanesthesia care in stable condition.  Plan for observation admission.   Please note, first assistant, Debbrah Alar, was crucial for all portions of the surgery today.  She provided invaluable retraction, suctioning, specimen manipulation, vascular stapling, lymphatic clipping, and general first assistance.  IN/NUANCE  D:07/21/2020 T:07/22/2020 JOB:[id number]

## 2020-07-22 NOTE — Progress Notes (Signed)
Pt ambulated times two in the hallway, noted.

## 2020-07-23 ENCOUNTER — Telehealth: Payer: Self-pay | Admitting: Cardiovascular Disease

## 2020-07-23 DIAGNOSIS — C61 Malignant neoplasm of prostate: Secondary | ICD-10-CM | POA: Diagnosis not present

## 2020-07-23 LAB — HEMOGLOBIN AND HEMATOCRIT, BLOOD
HCT: 34.4 % — ABNORMAL LOW (ref 39.0–52.0)
Hemoglobin: 11.5 g/dL — ABNORMAL LOW (ref 13.0–17.0)

## 2020-07-23 MED ORDER — ACETAMINOPHEN 325 MG PO TABS
650.0000 mg | ORAL_TABLET | ORAL | Status: DC | PRN
  Administered 2020-07-23: 650 mg via ORAL
  Filled 2020-07-23: qty 2

## 2020-07-23 NOTE — Progress Notes (Signed)
2 Days Post-Op Subjective: Patient reports feeling better. Some flatus last night.  Walked multiple times yesterday.  JP Cr consistent with serum.  Tolerating regular diet. Shoulder and fingers improving. Excellent UO.  Appropriate decrease in H/H which pt is tolerating well.   Objective: Vital signs in last 24 hours: Temp:  [97.4 F (36.3 C)-98.8 F (37.1 C)] 98.5 F (36.9 C) (11/12 0420) Pulse Rate:  [81-98] 94 (11/12 0420) Resp:  [16-18] 18 (11/12 0420) BP: (125-145)/(81-87) 125/83 (11/12 0420) SpO2:  [91 %-96 %] 94 % (11/12 0420)  Intake/Output from previous day: 11/11 0701 - 11/12 0700 In: 1763 [I.V.:1763] Out: 3296 [Urine:2975; Drains:321] Intake/Output this shift: No intake/output data recorded.  Physical Exam:  General:alert, cooperative and no distress GI: soft, non tender, normal bowel sounds, no palpable masses JP in place with minimal serosang drainage Foley with clear/yellow urine  Lab Results: Recent Labs    07/21/20 1619 07/22/20 0519 07/23/20 0505  HGB 15.9 13.7 11.5*  HCT 48.5 42.8 34.4*   BMET Recent Labs    07/22/20 0519  NA 134*  K 4.8  CL 102  CO2 21*  GLUCOSE 284*  BUN 23  CREATININE 1.77*  CALCIUM 8.1*   No results for input(s): LABPT, INR in the last 72 hours. No results for input(s): LABURIN in the last 72 hours. Results for orders placed or performed during the hospital encounter of 07/17/20  SARS CORONAVIRUS 2 (TAT 6-24 HRS) Nasopharyngeal Nasopharyngeal Swab     Status: None   Collection Time: 07/17/20  1:49 PM   Specimen: Nasopharyngeal Swab  Result Value Ref Range Status   SARS Coronavirus 2 NEGATIVE NEGATIVE Final    Comment: (NOTE) SARS-CoV-2 target nucleic acids are NOT DETECTED.  The SARS-CoV-2 RNA is generally detectable in upper and lower respiratory specimens during the acute phase of infection. Negative results do not preclude SARS-CoV-2 infection, do not rule out co-infections with other pathogens, and should not  be used as the sole basis for treatment or other patient management decisions. Negative results must be combined with clinical observations, patient history, and epidemiological information. The expected result is Negative.  Fact Sheet for Patients: SugarRoll.be  Fact Sheet for Healthcare Providers: https://www.woods-mathews.com/  This test is not yet approved or cleared by the [place] FDA and  has been authorized for detection and/or diagnosis of SARS-CoV-2 by FDA under an Emergency Use Authorization (EUA). This EUA will remain  in effect (meaning this test can be used) for the duration of the COVID-19 declaration under Se ction 564(b)(1) of the Act, 21 U.S.C. section 360bbb-3(b)(1), unless the authorization is terminated or revoked sooner.  Performed at [facility], [place] [address], [place], [place] [address]     Studies/Results: No results found.  Assessment/Plan: 2 Days Post-Op Procedure(s) (LRB): XI ROBOTIC ASSISTED LAPAROSCOPIC RADICAL PROSTATECTOMY, BILATERAL INGUINAL HERNIA REPAIR (N/A) LYMPHADENECTOMY (Bilateral)  Doing well  D/c JP and IV  Continue amb and IS  D/c home   LOS: 0 days   [name] [name] 07/23/2020, 7:23 AM

## 2020-07-23 NOTE — Discharge Summary (Signed)
  Date of admission: 07/21/2020  Date of discharge: 07/23/2020  Admission diagnosis: Prostate Cancer  Discharge diagnosis: Prostate Cancer  History and Physical: For full details, please see admission history and physical. Briefly, [name] [name] is a 74 y.o. gentleman with localized prostate cancer.  After discussing management/treatment options, he elected to proceed with surgical treatment.  Hospital Course: [name] [name] was taken to the operating room on 07/21/2020 and underwent a robotic assisted laparoscopic radical prostatectomy. He tolerated this procedure well and without complications. Postoperatively, he was able to be transferred to a regular hospital room following recovery from anesthesia.  He was able to begin ambulating the night of surgery. He remained hemodynamically stable overnight.  JP Cr was consistent with serum. He had excellent urine output with appropriately minimal output from his pelvic drain and his pelvic drain was removed on POD #2.  He was transitioned to oral pain medication, tolerated a reglar liquid diet, and had met all discharge criteria and was able to be discharged home later on POD#2.  Laboratory values: Recent Labs    07/21/20 1619 07/22/20 0519 07/23/20 0505  HGB 15.9 13.7 11.5*  HCT 48.5 42.8 34.4*    Disposition: Home  Discharge instruction: He was instructed to be ambulatory but to refrain from heavy lifting, strenuous activity, or driving. He was instructed on urethral catheter care.  Discharge medications:   Allergies as of 07/23/2020   No Known Allergies     Medication List    TAKE these medications   acetaminophen 500 MG tablet Commonly known as: TYLENOL Take 1,000 mg by mouth every 6 (six) hours as needed for moderate pain or headache.   allopurinol 100 MG tablet Commonly known as: ZYLOPRIM Take 100 mg by mouth in the morning and at bedtime.   BACLOFEN PO Take by mouth.   fluticasone 50 MCG/ACT nasal spray Commonly  known as: FLONASE Place 1 spray into both nostrils once a week.   HYDROcodone-acetaminophen 5-325 MG tablet Commonly known as: Norco Take 1-2 tablets by mouth every 6 (six) hours as needed for moderate pain or severe pain.   metoprolol succinate 25 MG 24 hr tablet Commonly known as: TOPROL-XL Take 25 mg by mouth daily.   sildenafil 100 MG tablet Commonly known as: VIAGRA Take 100 mg by mouth daily as needed for erectile dysfunction.   sulfamethoxazole-trimethoprim 800-160 MG tablet Commonly known as: BACTRIM DS Take 1 tablet by mouth 2 (two) times daily. Start the day prior to foley removal appointment       Followup: He will followup in 1 week for catheter removal and to discuss his surgical pathology results.

## 2020-07-23 NOTE — Plan of Care (Signed)
?  Problem: Clinical Measurements: ?Goal: Will remain free from infection ?Outcome: Progressing ?  ?Problem: Activity: ?Goal: Risk for activity intolerance will decrease ?Outcome: Progressing ?  ?Problem: Elimination: ?Goal: Will not experience complications related to bowel motility ?Outcome: Progressing ?  ?Problem: Pain Managment: ?Goal: General experience of comfort will improve ?Outcome: Progressing ?  ?

## 2020-07-23 NOTE — Telephone Encounter (Signed)
The patient has been notified of the result and verbalized understanding.  All questions (if any) were answered. Ewell Poe Ellsworth, RN 07/23/2020 4:37 PM

## 2020-07-23 NOTE — Progress Notes (Signed)
Pt ambulated in hall, tolerated well

## 2020-07-23 NOTE — Telephone Encounter (Signed)
Patient returning call for echo results. 

## 2020-07-29 LAB — SURGICAL PATHOLOGY

## 2020-08-13 NOTE — Progress Notes (Signed)
CARDIOLOGY CONSULT NOTE       Patient ID: [name] [name] MRN: [id number] DOB/AGE: November 08, 1945 74 y.o.  Admit date: (Not on file) Referring Physician: [name] Urology/ [name] Primary  Primary Physician: [name], MD Primary Cardiologist: New Reason for Consultation: Afib / Preoperative clearance  Active Problems:   * No active hospital problems. *   HPI:  74 y.o. referred by Dr [name] for preoperative clearance on 07/19/20 . ECG done 07/14/20 routine preoperative showed afib at controlled rate with normal ST segments. 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 A-fib ([place])   . Arthritis   . Frequency of urination   . Hematuria   . History of gout   . History of kidney stones   . Hypertension   . Pneumonia 1981   History of  . Prostate cancer ([place])   . Right ureteral stone   . Urgency of urination     No family history on file.  Social History   Socioeconomic History  . Marital status: Married    Spouse name: Not on file  . Number of children: Not on file  . Years of education: Not on file  . Highest education level: Not on file  Occupational History  . Not on  file  Tobacco Use  . Smoking status: Never Smoker  . Smokeless tobacco: Former Systems developer    Types: Secondary school teacher  . Vaping Use: Never used  Substance and Sexual Activity  . Alcohol use: Yes    Comment: social  . Drug use: No  . Sexual activity: Not on file  Other Topics Concern  . Not on file  Social History Narrative   ** Merged History Encounter **       Social Determinants of Health   Financial Resource Strain:   . Difficulty of Paying Living Expenses: Not on file  Food Insecurity:   . Worried About Charity fundraiser in the Last Year: Not on file  . Ran Out of Food in the Last Year: Not on file  Transportation Needs:   . Lack of Transportation (Medical): Not on file  . Lack of Transportation (Non-Medical): Not on file  Physical Activity:   . Days of Exercise per Week: Not on file  . Minutes of Exercise per Session: Not on file  Stress:   . Feeling of Stress : Not on file  Social Connections:   . Frequency of Communication with Friends and Family: Not on file  . Frequency of Social Gatherings with Friends and Family: Not on file  . Attends Religious Services: Not on file  . Active Member  of Clubs or Organizations: Not on file  . Attends Archivist Meetings: Not on file  . Marital Status: Not on file  Intimate Partner Violence:   . Fear of Current or Ex-Partner: Not on file  . Emotionally Abused: Not on file  . Physically Abused: Not on file  . Sexually Abused: Not on file    Past Surgical History:  Procedure Laterality Date  . ACHILLES TENDON SURGERY Left   . CHOLECYSTECTOMY    . CYST EXCISION     finger  . CYSTO/  RETROGRADE PYELOGRAM/ RIGHT URETERAL PLACEMENT  04-04-2013  . CYSTOSCOPY WITH RETROGRADE PYELOGRAM, URETEROSCOPY AND STENT PLACEMENT Right 04/04/2013   Procedure: CYSTOSCOPY WITH RIGHT RETROGRADE PYELOGRAM, AND RIGHT STENT PLACEMENT, DIAGNOSTIC RIGHT URETEROSCOPY;  Surgeon: Alexis Frock, MD;  Location: [facility];  Service: Urology;   Laterality: Right;  . CYSTOSCOPY WITH URETEROSCOPY AND STENT PLACEMENT Right 05/07/2013   Procedure: CYSTOSCOPY WITH URETEROSCOPY AND STENT PLACEMENT;  Surgeon: Alexis Frock, MD;  [facility];  Service: Urology;  Laterality: Right;  . HOLMIUM LASER APPLICATION Right 1/44/3154   Procedure: HOLMIUM LASER APPLICATION;  Surgeon: Alexis Frock, MD;  Location: [facility];  Service: Urology;  Laterality: Right;  . KNEE ARTHROSCOPY W/ MENISCECTOMY Right   . LAPAROSCOPIC CHOLECYSTECTOMY  2003  . LYMPHADENECTOMY Bilateral 07/21/2020   Procedure: LYMPHADENECTOMY;  Surgeon: Alexis Frock, MD;  Location: [facility];  Service: Urology;  Laterality: Bilateral;  . ROBOT ASSISTED LAPAROSCOPIC RADICAL PROSTATECTOMY N/A 07/21/2020   Procedure: XI ROBOTIC ASSISTED LAPAROSCOPIC RADICAL PROSTATECTOMY, BILATERAL INGUINAL HERNIA REPAIR;  Surgeon: Alexis Frock, MD;  Location: [facility];  Service: Urology;  Laterality: N/A;  3 HRS  . TOTAL KNEE ARTHROPLASTY  10/08/2012   Procedure: TOTAL KNEE ARTHROPLASTY;  Surgeon: Sydnee Cabal, MD;  Location: [facility];  Service: Orthopedics;  Laterality: Right;      Current Outpatient Medications:  .  acetaminophen (TYLENOL) 500 MG tablet, Take 1,000 mg by mouth every 6 (six) hours as needed for moderate pain or headache. , Disp: , Rfl:  .  allopurinol (ZYLOPRIM) 100 MG tablet, Take 100 mg by mouth in the morning and at bedtime., Disp: , Rfl:  .  BACLOFEN PO, Take by mouth., Disp: , Rfl:  .  fluticasone (FLONASE) 50 MCG/ACT nasal spray, Place 1 spray into both nostrils once a week., Disp: , Rfl:  .  HYDROcodone-acetaminophen (NORCO) 5-325 MG tablet, Take 1-2 tablets by mouth every 6 (six) hours as needed for moderate pain or severe pain., Disp: 20 tablet, Rfl: 0 .  metoprolol succinate (TOPROL-XL) 25 MG 24 hr tablet, Take 25 mg by mouth daily., Disp: , Rfl:  .  sulfamethoxazole-trimethoprim (BACTRIM DS) 800-160 MG tablet, Take 1 tablet by mouth  2 (two) times daily. Start the day prior to foley removal appointment, Disp: 6 tablet, Rfl: 0    Physical Exam: There were no vitals taken for this visit.    Affect appropriate Healthy:  appears stated age 35: normal Neck supple with no adenopathy JVP normal no bruits no thyromegaly Lungs clear with no wheezing and good diaphragmatic motion Heart:  S1/S2 no murmur, no rub, gallop or click PMI normal Abdomen: benighn, post cholecystectomy and recent lap prostate surgery  Distal pulses intact with no bruits No edema Neuro non-focal Skin warm and dry Post right TKR    Labs:   Lab Results  Component Value Date   WBC 6.4 07/14/2020   HGB 11.5 (L) 07/23/2020   HCT 34.4 (L)  07/23/2020   MCV 93.3 07/14/2020   PLT 183 07/14/2020    No results for input(s): NA, K, CL, CO2, BUN, CREATININE, CALCIUM, PROT, BILITOT, ALKPHOS, ALT, AST, GLUCOSE in the last 168 hours.  Invalid input(s): LABALBU No results found for: CKTOTAL, CKMB, CKMBINDEX, TROPONINI No results found for: CHOL No results found for: HDL No results found for: LDLCALC No results found for: TRIG No results found for: CHOLHDL No results found for: LDLDIRECT    Radiology: No results found.  EKG: see HPI11/8/21 afib rate 65 nonspecific ST changes    ASSESSMENT AND PLAN:   1. Afib:  Rate control fine on Toprol he has healed pretty well and will start eliquis in a week Knows to Stop if he has any hematuria. F/u me / afib clinic to discuss [facility] in 4 weeks  3. HTN:  Improved on beta blocker  4. Dilated Aorta:  F/u CTA in a year BP well controlled on beta blocker now  5. Prostate:  Biopsy negative  post prostatectomy f/u Dr [name] Bone scan negative 05/07/20 He indicates Urine not totally clear yet so will start eliquis next week 08/24/20   F/U in 6 months   Signed: Jenkins Rouge 08/17/2020, 11:23 AM

## 2020-08-17 ENCOUNTER — Ambulatory Visit: Payer: Medicare PPO | Admitting: Cardiovascular Disease

## 2020-08-17 ENCOUNTER — Encounter: Payer: Self-pay | Admitting: Cardiovascular Disease

## 2020-08-17 ENCOUNTER — Other Ambulatory Visit: Payer: Self-pay

## 2020-08-17 VITALS — BP 130/82 | HR 65 | Ht 76.0 in | Wt 283.6 lb

## 2020-08-17 DIAGNOSIS — I4819 Other persistent atrial fibrillation: Secondary | ICD-10-CM | POA: Diagnosis not present

## 2020-08-17 MED ORDER — APIXABAN 5 MG PO TABS: 5.0000 mg | ORAL_TABLET | Freq: Two times a day (BID) | ORAL | 11 refills | Status: DC

## 2020-08-17 NOTE — Patient Instructions (Signed)
Medication Instructions:  Your physician has recommended you make the following change in your medication:  1-START Eliquis 5 mg by mouth twice daily.  *If you need a refill on your cardiac medications before your next appointment, please call your pharmacy*  Lab Work: If you have labs (blood work) drawn today and your tests are completely normal, you will receive your results only by: Marland Kitchen MyChart Message (if you have MyChart) OR . A paper copy in the mail If you have any lab test that is abnormal or we need to change your treatment, we will call you to review the results.  Follow-Up: At [facility], you and your health needs are our priority.  As part of our continuing mission to provide you with exceptional heart care, we have created designated Provider Care Teams.  These Care Teams include your primary Cardiologist (physician) and Advanced Practice Providers (APPs -  Physician Assistants and Nurse Practitioners) who all work together to provide you with the care you need, when you need it.  We recommend signing up for the patient portal called "MyChart".  Sign up information is provided on this After Visit Summary.  MyChart is used to connect with patients for Virtual Visits (Telemedicine).  Patients are able to view lab/test results, encounter notes, upcoming appointments, etc.  Non-urgent messages can be sent to your provider as well.   To learn more about what you can do with MyChart, go to [email or web address].    Your next appointment:   4 week(s)  The format for your next appointment:   In Person  Provider:   You will follow up in the [facility] located at [facility]. Your provider will be: Roderic Palau, NP

## 2020-09-22 ENCOUNTER — Other Ambulatory Visit: Payer: Self-pay

## 2020-09-22 ENCOUNTER — Ambulatory Visit (HOSPITAL_COMMUNITY)
Admission: RE | Admit: 2020-09-22 | Discharge: 2020-09-22 | Disposition: A | Discharge status: Home / Self Care | Payer: Medicare PPO | Source: Ambulatory Visit | Attending: Nurse Practitioner | Admitting: Nurse Practitioner

## 2020-09-22 VITALS — BP 170/84 | HR 76 | Ht 76.0 in | Wt 286.6 lb

## 2020-09-22 DIAGNOSIS — I4819 Other persistent atrial fibrillation: Secondary | ICD-10-CM

## 2020-09-22 DIAGNOSIS — I351 Nonrheumatic aortic (valve) insufficiency: Secondary | ICD-10-CM | POA: Diagnosis not present

## 2020-09-22 DIAGNOSIS — D6869 Other thrombophilia: Secondary | ICD-10-CM | POA: Diagnosis not present

## 2020-09-22 DIAGNOSIS — I4891 Unspecified atrial fibrillation: Secondary | ICD-10-CM | POA: Insufficient documentation

## 2020-09-22 DIAGNOSIS — I4510 Unspecified right bundle-branch block: Secondary | ICD-10-CM | POA: Insufficient documentation

## 2020-09-22 DIAGNOSIS — Z7901 Long term (current) use of anticoagulants: Secondary | ICD-10-CM | POA: Insufficient documentation

## 2020-09-22 DIAGNOSIS — Z9114 Patient's other noncompliance with medication regimen: Secondary | ICD-10-CM | POA: Insufficient documentation

## 2020-09-22 DIAGNOSIS — I77819 Aortic ectasia, unspecified site: Secondary | ICD-10-CM | POA: Diagnosis not present

## 2020-09-22 NOTE — Progress Notes (Addendum)
Primary Care Physician: [name], MD Referring Physician: Dr. Shirleen Schirmer AC GLADE is a 75 y.o. male with a h/o new onset  afib that is in the afib clinic  for f/u from Dr. Johnsie Cancel where he was started on eliquis.around 4 weeks ago. He was found to be in afib for pre op w/u for prostate surgery. Unfortunately, I can not set pt up for cardioversion  today as he has missed several doses of eliquis. He is asymptomatic, so we will start the 3 weeks over again today. He is rate controlled.   Today, he denies symptoms of palpitations, chest pain, shortness of breath, orthopnea, PND, lower extremity edema, dizziness, presyncope, syncope, or neurologic sequela. The patient is tolerating medications without difficulties and is otherwise without complaint today.   Past Medical History:  Diagnosis Date  . A-fib ([place])   . Arthritis   . Frequency of urination   . Hematuria   . History of gout   . History of kidney stones   . Hypertension   . Pneumonia 1981   History of  . Prostate cancer ([place])   . Right ureteral stone   . Urgency of urination    Past Surgical History:  Procedure Laterality Date  . ACHILLES TENDON SURGERY Left   . CHOLECYSTECTOMY    . CYST EXCISION     finger  . CYSTO/  RETROGRADE PYELOGRAM/ RIGHT URETERAL PLACEMENT  04-04-2013  . CYSTOSCOPY WITH RETROGRADE PYELOGRAM, URETEROSCOPY AND STENT PLACEMENT Right 04/04/2013   Procedure: CYSTOSCOPY WITH RIGHT RETROGRADE PYELOGRAM, AND RIGHT STENT PLACEMENT, DIAGNOSTIC RIGHT URETEROSCOPY;  Surgeon: Alexis Frock, MD;  Location[facility];  Service: Urology;  Laterality: Right;  . CYSTOSCOPY WITH URETEROSCOPY AND STENT PLACEMENT Right 05/07/2013   Procedure: CYSTOSCOPY WITH URETEROSCOPY AND STENT PLACEMENT;  Surgeon: Alexis Frock, MD;  [facility];  Service: Urology;  Laterality: Right;  . HOLMIUM LASER APPLICATION Right 0000000   Procedure: HOLMIUM LASER APPLICATION;  Surgeon: Alexis Frock, MD;   Location: [facility];  Service: Urology;  Laterality: Right;  . KNEE ARTHROSCOPY W/ MENISCECTOMY Right   . LAPAROSCOPIC CHOLECYSTECTOMY  2003  . LYMPHADENECTOMY Bilateral 07/21/2020   Procedure: LYMPHADENECTOMY;  Surgeon: Alexis Frock, MD;  [facility];  Service: Urology;  Laterality: Bilateral;  . ROBOT ASSISTED LAPAROSCOPIC RADICAL PROSTATECTOMY N/A 07/21/2020   Procedure: XI ROBOTIC ASSISTED LAPAROSCOPIC RADICAL PROSTATECTOMY, BILATERAL INGUINAL HERNIA REPAIR;  Surgeon: Alexis Frock, MD;  [facility];  Service: Urology;  Laterality: N/A;  3 HRS  . TOTAL KNEE ARTHROPLASTY  10/08/2012   Procedure: TOTAL KNEE ARTHROPLASTY;  Surgeon: Sydnee Cabal, MD;  Location: [facility];  Service: Orthopedics;  Laterality: Right;    Current Outpatient Medications  Medication Sig Dispense Refill  . acetaminophen (TYLENOL) 500 MG tablet Take 1,000 mg by mouth as needed for moderate pain or headache.    . allopurinol (ZYLOPRIM) 100 MG tablet Take 100 mg by mouth in the morning and at bedtime.    Marland Kitchen apixaban (ELIQUIS) 5 MG TABS tablet Take 1 tablet (5 mg total) by mouth 2 (two) times daily. 60 tablet 11  . BACTRIM DS 800-160 MG tablet For dental procedures 3-4 tablets - 30 mins prior to dental work    . fluticasone (FLONASE) 50 MCG/ACT nasal spray Place 1 spray into both nostrils once a week.    . metoprolol succinate (TOPROL-XL) 25 MG 24 hr tablet Take 25 mg by mouth daily.     No  current facility-administered medications for this encounter.    No Known Allergies  Social History   Socioeconomic History  . Marital status: Married    Spouse name: Not on file  . Number of children: Not on file  . Years of education: Not on file  . Highest education level: Not on file  Occupational History  . Not on file  Tobacco Use  . Smoking status: Never Smoker  . Smokeless tobacco: Former Systems developer    Types: Secondary school teacher  . Vaping Use: Never used  Substance and Sexual Activity  .  Alcohol use: Yes    Comment: social  . Drug use: No  . Sexual activity: Not on file  Other Topics Concern  . Not on file  Social History Narrative   ** Merged History Encounter **       Social Determinants of Health   Financial Resource Strain: Not on file  Food Insecurity: Not on file  Transportation Needs: Not on file  Physical Activity: Not on file  Stress: Not on file  Social Connections: Not on file  Intimate Partner Violence: Not on file    No family history on file.  ROS- All systems are reviewed and negative except as per the HPI above  Physical Exam: Vitals:   09/22/20 1403  BP: (!) 170/84  Pulse: 76  Weight: 130 kg  Height: 6\' 4"  (1.93 m)   Wt Readings from Last 3 Encounters:  09/22/20 130 kg  08/17/20 128.6 kg  07/21/20 132.2 kg    Labs: Lab Results  Component Value Date   NA 134 (L) 07/22/2020   K 4.8 07/22/2020   CL 102 07/22/2020   CO2 21 (L) 07/22/2020   GLUCOSE 284 (H) 07/22/2020   BUN 23 07/22/2020   CREATININE 1.77 (H) 07/22/2020   CALCIUM 8.1 (L) 07/22/2020   Lab Results  Component Value Date   INR 1.00 10/01/2012   No results found for: CHOL, HDL, LDLCALC, TRIG   GEN- The patient is well appearing, alert and oriented x 3 today.   Head- normocephalic, atraumatic Eyes-  Sclera clear, conjunctiva pink Ears- hearing intact Oropharynx- clear Neck- supple, no JVP Lymph- no cervical lymphadenopathy Lungs- Clear to ausculation bilaterally, normal work of breathing Heart- irregular rate and rhythm, no murmurs, rubs or gallops, PMI not laterally displaced GI- soft, NT, ND, + BS Extremities- no clubbing, cyanosis, or edema MS- no significant deformity or atrophy Skin- no rash or lesion Psych- euthymic mood, full affect Neuro- strength and sensation are intact  EKG-afib at 76 bpm, IRBBB, qrs int 96 ms, qtc 463 ms  Echo-1. Left ventricular ejection fraction, by estimation, is 55 to 60%. The  left ventricle has normal function. The  left ventricle has no regional  wall motion abnormalities. There is moderate concentric left ventricular  hypertrophy. Left ventricular  diastolic parameters are indeterminate.  2. Right ventricular systolic function is normal. The right ventricular  size is normal.  3. Left atrial size was mildly dilated.  4. The mitral valve is normal in structure. Trivial mitral valve  regurgitation.  5. The aortic valve is tricuspid. Aortic valve regurgitation is mild.  6. Aortic dilatation noted. There is mild dilatation of the aortic root,  measuring 40 mm. There is moderate dilatation of the ascending aorta,  measuring 45 mm.  7. The inferior vena cava is normal in size with greater than 50%  respiratory variability, suggesting right atrial pressure of 3 mmHg.   Comparison(s): No prior  Echocardiogram.   FINDINGS  Left Ventricle: Left ventricular ejection fraction, by estimation, is 55  to 60%. The left ventricle has normal function. The left ventricle has no  regional wall motion abnormalities. Definity contrast agent was given IV  to delineate the left ventricular  endocardial borders. The left ventricular internal cavity size was normal  in size. There is moderate concentric left ventricular hypertrophy. Left  ventricular diastolic parameters are indeterminate.   Right Ventricle: The right ventricular size is normal. Right vetricular  wall thickness was not well visualized. Right ventricular systolic  function is normal.   Left Atrium: Left atrial size was mildly dilated.   Right Atrium: Right atrial size was normal in size.   Pericardium: There is no evidence of pericardial effusion.    Assessment and Plan: 1. New onset afib General discussion re afib Pt is asymptomatic and rate controlled Continue toprol 25 mg daily   2. CHA2DS2VASc score of 1(age)  Has missed several doses of eliquis Will start the 21 days of uninterrupted anticoagulation tonight Will bring back in 2  weeks and then get cardioversion scheduled after another week Reminded not to miss any anticoagulation   Butch Penny C. Leib Elahi, [facility] [address] [place], [place] [address] [phone]

## 2020-10-06 ENCOUNTER — Other Ambulatory Visit: Payer: Self-pay

## 2020-10-06 ENCOUNTER — Ambulatory Visit (HOSPITAL_COMMUNITY)
Admission: RE | Admit: 2020-10-06 | Discharge: 2020-10-06 | Disposition: A | Discharge status: Home / Self Care | Payer: Medicare PPO | Source: Ambulatory Visit | Attending: Nurse Practitioner | Admitting: Nurse Practitioner

## 2020-10-06 ENCOUNTER — Encounter (HOSPITAL_COMMUNITY): Payer: Self-pay | Admitting: Nurse Practitioner

## 2020-10-06 VITALS — BP 168/86 | HR 64 | Ht 76.0 in | Wt 286.4 lb

## 2020-10-06 DIAGNOSIS — I4510 Unspecified right bundle-branch block: Secondary | ICD-10-CM | POA: Diagnosis not present

## 2020-10-06 DIAGNOSIS — I4891 Unspecified atrial fibrillation: Secondary | ICD-10-CM | POA: Diagnosis present

## 2020-10-06 DIAGNOSIS — Z7901 Long term (current) use of anticoagulants: Secondary | ICD-10-CM | POA: Insufficient documentation

## 2020-10-06 DIAGNOSIS — D6869 Other thrombophilia: Secondary | ICD-10-CM

## 2020-10-06 DIAGNOSIS — I4819 Other persistent atrial fibrillation: Secondary | ICD-10-CM | POA: Diagnosis not present

## 2020-10-06 LAB — CBC
HCT: 43.3 % (ref 39.0–52.0)
Hemoglobin: 14.6 g/dL (ref 13.0–17.0)
MCH: 30 pg (ref 26.0–34.0)
MCHC: 33.7 g/dL (ref 30.0–36.0)
MCV: 89.1 fL (ref 80.0–100.0)
Platelets: 196 10*3/uL (ref 150–400)
RBC: 4.86 MIL/uL (ref 4.22–5.81)
RDW: 14.7 % (ref 11.5–15.5)
WBC: 6.1 10*3/uL (ref 4.0–10.5)
nRBC: 0 % (ref 0.0–0.2)

## 2020-10-06 LAB — BASIC METABOLIC PANEL
Anion gap: 8 (ref 5–15)
BUN: 15 mg/dL (ref 8–23)
CO2: 29 mmol/L (ref 22–32)
Calcium: 9.5 mg/dL (ref 8.9–10.3)
Chloride: 105 mmol/L (ref 98–111)
Creatinine, Ser: 1.19 mg/dL (ref 0.61–1.24)
GFR, Estimated: >60 mL/min (ref >60)
Glucose, Bld: 97 mg/dL (ref 70–99)
Potassium: 4.2 mmol/L (ref 3.5–5.1)
Sodium: 142 mmol/L (ref 135–145)

## 2020-10-06 NOTE — Addendum Note (Signed)
Encounter addended by: Sherran Needs, NP on: 10/06/2020 3:26 PM  Actions taken: Clinical Note Signed

## 2020-10-06 NOTE — H&P (View-Only) (Signed)
 Primary Care Physician: Wilson, Fred H, MD Referring Physician: Dr. Nishan    Amias J Medeiros is a 75 y.o. male with a h/o new onset  afib that is in the afib clinic  for f/u from Dr. Nishan where he was started on eliquis.around 4 weeks ago. He was found to be in afib for pre op w/u for prostate surgery. Unfortunately, I can not set pt up for cardioversion  today as he has missed several doses of eliquis. He is asymptomatic, so we will start the 3 weeks over again today. He is rate controlled.   F/u in afib clinic 10/06/20. He has not missed any anticoagulation for the last 2 weeks. Will set up cardioversion for after 2/2. He still is tolerating afib well. He is rate controlled.   Today, he denies symptoms of palpitations, chest pain, shortness of breath, orthopnea, PND, lower extremity edema, dizziness, presyncope, syncope, or neurologic sequela. The patient is tolerating medications without difficulties and is otherwise without complaint today.   Past Medical History:  Diagnosis Date  . A-fib (HCC)   . Arthritis   . Frequency of urination   . Hematuria   . History of gout   . History of kidney stones   . Hypertension   . Pneumonia 1981   History of  . Prostate cancer (HCC)   . Right ureteral stone   . Urgency of urination    Past Surgical History:  Procedure Laterality Date  . ACHILLES TENDON SURGERY Left   . CHOLECYSTECTOMY    . CYST EXCISION     finger  . CYSTO/  RETROGRADE PYELOGRAM/ RIGHT URETERAL PLACEMENT  04-04-2013  . CYSTOSCOPY WITH RETROGRADE PYELOGRAM, URETEROSCOPY AND STENT PLACEMENT Right 04/04/2013   Procedure: CYSTOSCOPY WITH RIGHT RETROGRADE PYELOGRAM, AND RIGHT STENT PLACEMENT, DIAGNOSTIC RIGHT URETEROSCOPY;  Surgeon: Theodore Manny, MD;  Location: [facility];  Service: Urology;  Laterality: Right;  . CYSTOSCOPY WITH URETEROSCOPY AND STENT PLACEMENT Right 05/07/2013   Procedure: CYSTOSCOPY WITH URETEROSCOPY AND STENT PLACEMENT;  Surgeon: Theodore Manny, MD;   Location: [facility];  Service: Urology;  Laterality: Right;  . HOLMIUM LASER APPLICATION Right 05/07/2013   Procedure: HOLMIUM LASER APPLICATION;  Surgeon: Theodore Manny, MD;  Location:  SURGERY CENTER;  Service: Urology;  Laterality: Right;  . KNEE ARTHROSCOPY W/ MENISCECTOMY Right   . LAPAROSCOPIC CHOLECYSTECTOMY  2003  . LYMPHADENECTOMY Bilateral 07/21/2020   Procedure: LYMPHADENECTOMY;  Surgeon: Manny, Theodore, MD;  Location: [facility];  Service: Urology;  Laterality: Bilateral;  . ROBOT ASSISTED LAPAROSCOPIC RADICAL PROSTATECTOMY N/A 07/21/2020   Procedure: XI ROBOTIC ASSISTED LAPAROSCOPIC RADICAL PROSTATECTOMY, BILATERAL INGUINAL HERNIA REPAIR;  Surgeon: Manny, Theodore, MD;  Location: [facility];  Service: Urology;  Laterality: N/A;  3 HRS  . TOTAL KNEE ARTHROPLASTY  10/08/2012   Procedure: TOTAL KNEE ARTHROPLASTY;  Surgeon: Robert Collins, MD;  Location: [facility];  Service: Orthopedics;  Laterality: Right;    Current Outpatient Medications  Medication Sig Dispense Refill  . acetaminophen (TYLENOL) 500 MG tablet Take 1,000 mg by mouth as needed for moderate pain or headache.    . allopurinol (ZYLOPRIM) 100 MG tablet Take 100 mg by mouth in the morning and at bedtime.    . apixaban (ELIQUIS) 5 MG TABS tablet Take 1 tablet (5 mg total) by mouth 2 (two) times daily. 60 tablet 11  . BACTRIM DS 800-160 MG tablet For dental procedures 3-4 tablets - 30 mins prior to dental work    . fluticasone (FLONASE) 50   MCG/ACT nasal spray Place 1 spray into both nostrils once a week.    . metoprolol succinate (TOPROL-XL) 25 MG 24 hr tablet Take 25 mg by mouth daily.     No current facility-administered medications for this encounter.    No Known Allergies  Social History   Socioeconomic History  . Marital status: Married    Spouse name: Not on file  . Number of children: Not on file  . Years of education: Not on file  . Highest education level: Not on file  Occupational  History  . Not on file  Tobacco Use  . Smoking status: Never Smoker  . Smokeless tobacco: Former Systems developer    Types: Secondary school teacher  . Vaping Use: Never used  Substance and Sexual Activity  . Alcohol use: Yes    Comment: social  . Drug use: No  . Sexual activity: Not on file  Other Topics Concern  . Not on file  Social History Narrative   ** Merged History Encounter **       Social Determinants of Health   Financial Resource Strain: Not on file  Food Insecurity: Not on file  Transportation Needs: Not on file  Physical Activity: Not on file  Stress: Not on file  Social Connections: Not on file  Intimate Partner Violence: Not on file    No family history on file.  ROS- All systems are reviewed and negative except as per the HPI above  Physical Exam: Vitals:   10/06/20 1455  BP: (!) 168/86  Pulse: 64  Weight: 129.9 kg  Height: 6\' 4"  (1.93 m)   Wt Readings from Last 3 Encounters:  10/06/20 129.9 kg  09/22/20 130 kg  08/17/20 128.6 kg    Labs: Lab Results  Component Value Date   NA 134 (L) 07/22/2020   K 4.8 07/22/2020   CL 102 07/22/2020   CO2 21 (L) 07/22/2020   GLUCOSE 284 (H) 07/22/2020   BUN 23 07/22/2020   CREATININE 1.77 (H) 07/22/2020   CALCIUM 8.1 (L) 07/22/2020   Lab Results  Component Value Date   INR 1.00 10/01/2012   No results found for: CHOL, HDL, LDLCALC, TRIG   GEN- The patient is well appearing, alert and oriented x 3 today.   Head- normocephalic, atraumatic Eyes-  Sclera clear, conjunctiva pink Ears- hearing intact Oropharynx- clear Neck- supple, no JVP Lymph- no cervical lymphadenopathy Lungs- Clear to ausculation bilaterally, normal work of breathing Heart- irregular rate and rhythm, no murmurs, rubs or gallops, PMI not laterally displaced GI- soft, NT, ND, + BS Extremities- no clubbing, cyanosis, or edema MS- no significant deformity or atrophy Skin- no rash or lesion Psych- euthymic mood, full affect Neuro- strength and  sensation are intact  EKG-afib at 64 bpm, IRBBB, qrs int 98 ms, qtc 433 ms  Echo-1. Left ventricular ejection fraction, by estimation, is 55 to 60%. The  left ventricle has normal function. The left ventricle has no regional  wall motion abnormalities. There is moderate concentric left ventricular  hypertrophy. Left ventricular  diastolic parameters are indeterminate.  2. Right ventricular systolic function is normal. The right ventricular  size is normal.  3. Left atrial size was mildly dilated.  4. The mitral valve is normal in structure. Trivial mitral valve  regurgitation.  5. The aortic valve is tricuspid. Aortic valve regurgitation is mild.  6. Aortic dilatation noted. There is mild dilatation of the aortic root,  measuring 40 mm. 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  7. The inferior vena cava is normal in size with greater than 50%  respiratory variability, suggesting right atrial pressure of 3 mmHg.   Comparison(s): No prior Echocardiogram.   FINDINGS  Left Ventricle: Left ventricular ejection fraction, by estimation, is 55  to 60%. The left ventricle has normal function. The left ventricle has no  regional wall motion abnormalities. Definity contrast agent was given IV  to delineate the left ventricular  endocardial borders. The left ventricular internal cavity size was normal  in size. There is moderate concentric left ventricular hypertrophy. Left  ventricular diastolic parameters are indeterminate.   Right Ventricle: The right ventricular size is normal. Right vetricular  wall thickness was not well visualized. Right ventricular systolic  function is normal.   Left Atrium: Left atrial size was mildly dilated.   Right Atrium: Right atrial size was normal in size.   Pericardium: There is no evidence of pericardial effusion.    Assessment and Plan: 1. New onset afib Nov 2021 General discussion re afib Pt is asymptomatic  and rate controlled Continue toprol 25 mg daily   2. CHA2DS2VASc score of 1(age)  Has missed several doses of eliquis He  has been compliant for the last 2 weeks and will have CV scheduled after 2/2.   Reminded not to miss any anticoagulation  Risk vrs benefit of cardioversion discussed  Cbc/bmet/covid   F/u in afib clinic in one week   Butch Penny C. Takeshia Wenk, [facility] [address] [place], [place] [address] [phone]

## 2020-10-06 NOTE — Patient Instructions (Addendum)
Cardioversion scheduled for Thursday, February 3rd  - Arrive at the [facility] and go to admitting at [facility] not eat or drink anything after midnight the night prior to your procedure.  - Take all your morning medication (except diabetic medications) with a sip of water prior to arrival.  - You will not be able to drive home after your procedure.  - Do NOT miss any doses of your blood thinner - if you should miss a dose please notify our office immediately.  - If you feel as if you go back into normal rhythm prior to scheduled cardioversion, please notify our office immediately. If your procedure is canceled in the cardioversion suite you will be charged a cancellation fee.

## 2020-10-06 NOTE — Progress Notes (Addendum)
Primary Care Physician: [name] Sacramento, MD Referring Physician: Dr. Shirleen Schirmer [name] is a 75 y.o. male with a h/o new onset  afib that is in the afib clinic  for f/u from Dr. Johnsie [name] where he was started on eliquis.around 4 weeks ago. He was found to be in afib for pre op w/u for prostate surgery. Unfortunately, I can not set pt up for cardioversion  today as he has missed several doses of eliquis. He is asymptomatic, so we will start the 3 weeks over again today. He is rate controlled.   F/u in afib clinic 10/06/20. He has not missed any anticoagulation for the last 2 weeks. Will set up cardioversion for after 2/2. He still is tolerating afib well. He is rate controlled.   Today, he denies symptoms of palpitations, chest pain, shortness of breath, orthopnea, PND, lower extremity edema, dizziness, presyncope, syncope, or neurologic sequela. The patient is tolerating medications without difficulties and is otherwise without complaint today.   Past Medical History:  Diagnosis Date  . A-fib ([place])   . Arthritis   . Frequency of urination   . Hematuria   . History of gout   . History of kidney stones   . Hypertension   . Pneumonia 1981   History of  . Prostate cancer ([place])   . Right ureteral stone   . Urgency of urination    Past Surgical History:  Procedure Laterality Date  . ACHILLES TENDON SURGERY Left   . CHOLECYSTECTOMY    . CYST EXCISION     finger  . CYSTO/  RETROGRADE PYELOGRAM/ RIGHT URETERAL PLACEMENT  04-04-2013  . CYSTOSCOPY WITH RETROGRADE PYELOGRAM, URETEROSCOPY AND STENT PLACEMENT Right 04/04/2013   Procedure: CYSTOSCOPY WITH RIGHT RETROGRADE PYELOGRAM, AND RIGHT STENT PLACEMENT, DIAGNOSTIC RIGHT URETEROSCOPY;  Surgeon: [name] Frock, MD;  Location: [facility];  Service: Urology;  Laterality: Right;  . CYSTOSCOPY WITH URETEROSCOPY AND STENT PLACEMENT Right 05/07/2013   Procedure: CYSTOSCOPY WITH URETEROSCOPY AND STENT PLACEMENT;  Surgeon: [name] Frock, MD;   Location: [facility];  Service: Urology;  Laterality: Right;  . HOLMIUM LASER APPLICATION Right 0000000   Procedure: HOLMIUM LASER APPLICATION;  Surgeon: [name] Frock, MD;  Location: [facility];  Service: Urology;  Laterality: Right;  . KNEE ARTHROSCOPY W/ MENISCECTOMY Right   . LAPAROSCOPIC CHOLECYSTECTOMY  2003  . LYMPHADENECTOMY Bilateral 07/21/2020   Procedure: LYMPHADENECTOMY;  Surgeon: [name] Frock, MD;  Location: [facility];  Service: Urology;  Laterality: Bilateral;  . ROBOT ASSISTED LAPAROSCOPIC RADICAL PROSTATECTOMY N/A 07/21/2020   Procedure: XI ROBOTIC ASSISTED LAPAROSCOPIC RADICAL PROSTATECTOMY, BILATERAL INGUINAL HERNIA REPAIR;  Surgeon: [name] Frock, MD;  Location: [facility];  Service: Urology;  Laterality: N/A;  3 HRS  . TOTAL KNEE ARTHROPLASTY  10/08/2012   Procedure: TOTAL KNEE ARTHROPLASTY;  Surgeon: [name] Cabal, MD;  Location: [facility];  Service: Orthopedics;  Laterality: Right;    Current Outpatient Medications  Medication Sig Dispense Refill  . acetaminophen (TYLENOL) 500 MG tablet Take 1,000 mg by mouth as needed for moderate pain or headache.    . allopurinol (ZYLOPRIM) 100 MG tablet Take 100 mg by mouth in the morning and at bedtime.    Marland Kitchen apixaban (ELIQUIS) 5 MG TABS tablet Take 1 tablet (5 mg total) by mouth 2 (two) times daily. 60 tablet 11  . BACTRIM DS 800-160 MG tablet For dental procedures 3-4 tablets - 30 mins prior to dental work    . fluticasone (FLONASE) 50  MCG/ACT nasal spray Place 1 spray into both nostrils once a week.    . metoprolol succinate (TOPROL-XL) 25 MG 24 hr tablet Take 25 mg by mouth daily.     No current facility-administered medications for this encounter.    No Known Allergies  Social History   Socioeconomic History  . Marital status: Married    Spouse name: Not on file  . Number of children: Not on file  . Years of education: Not on file  . Highest education level: Not on file  Occupational  History  . Not on file  Tobacco Use  . Smoking status: Never Smoker  . Smokeless tobacco: Former Systems developer    Types: Secondary school teacher  . Vaping Use: Never used  Substance and Sexual Activity  . Alcohol use: Yes    Comment: social  . Drug use: No  . Sexual activity: Not on file  Other Topics Concern  . Not on file  Social History Narrative   ** Merged History Encounter **       Social Determinants of Health   Financial Resource Strain: Not on file  Food Insecurity: Not on file  Transportation Needs: Not on file  Physical Activity: Not on file  Stress: Not on file  Social Connections: Not on file  Intimate Partner Violence: Not on file    No family history on file.  ROS- All systems are reviewed and negative except as per the HPI above  Physical Exam: Vitals:   10/06/20 1455  BP: (!) 168/86  Pulse: 64  Weight: 129.9 kg  Height: 6\' 4"  (1.93 m)   Wt Readings from Last 3 Encounters:  10/06/20 129.9 kg  09/22/20 130 kg  08/17/20 128.6 kg    Labs: Lab Results  Component Value Date   NA 134 (L) 07/22/2020   K 4.8 07/22/2020   CL 102 07/22/2020   CO2 21 (L) 07/22/2020   GLUCOSE 284 (H) 07/22/2020   BUN 23 07/22/2020   CREATININE 1.77 (H) 07/22/2020   CALCIUM 8.1 (L) 07/22/2020   Lab Results  Component Value Date   INR 1.00 10/01/2012   No results found for: CHOL, HDL, LDLCALC, TRIG   GEN- The patient is well appearing, alert and oriented x 3 today.   Head- normocephalic, atraumatic Eyes-  Sclera clear, conjunctiva pink Ears- hearing intact Oropharynx- clear Neck- supple, no JVP Lymph- no cervical lymphadenopathy Lungs- Clear to ausculation bilaterally, normal work of breathing Heart- irregular rate and rhythm, no murmurs, rubs or gallops, PMI not laterally displaced GI- soft, NT, ND, + BS Extremities- no clubbing, cyanosis, or edema MS- no significant deformity or atrophy Skin- no rash or lesion Psych- euthymic mood, full affect Neuro- strength and  sensation are intact  EKG-afib at 64 bpm, IRBBB, qrs int 98 ms, qtc 433 ms  Echo-1. Left ventricular ejection fraction, by estimation, is 55 to 60%. The  left ventricle has normal function. The left ventricle has no regional  wall motion abnormalities. There is moderate concentric left ventricular  hypertrophy. Left ventricular  diastolic parameters are indeterminate.  2. Right ventricular systolic function is normal. The right ventricular  size is normal.  3. Left atrial size was mildly dilated.  4. The mitral valve is normal in structure. Trivial mitral valve  regurgitation.  5. The aortic valve is tricuspid. Aortic valve regurgitation is mild.  6. Aortic dilatation noted. There is mild dilatation of the aortic root,  measuring 40 mm. 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  7. The inferior vena cava is normal in size with greater than 50%  respiratory variability, suggesting right atrial pressure of 3 mmHg.   Comparison(s): No prior Echocardiogram.   FINDINGS  Left Ventricle: Left ventricular ejection fraction, by estimation, is 55  to 60%. The left ventricle has normal function. The left ventricle has no  regional wall motion abnormalities. Definity contrast agent was given IV  to delineate the left ventricular  endocardial borders. The left ventricular internal cavity size was normal  in size. There is moderate concentric left ventricular hypertrophy. Left  ventricular diastolic parameters are indeterminate.   Right Ventricle: The right ventricular size is normal. Right vetricular  wall thickness was not well visualized. Right ventricular systolic  function is normal.   Left Atrium: Left atrial size was mildly dilated.   Right Atrium: Right atrial size was normal in size.   Pericardium: There is no evidence of pericardial effusion.    Assessment and Plan: 1. New onset afib Nov 2021 General discussion re afib Pt is asymptomatic  and rate controlled Continue toprol 25 mg daily   2. CHA2DS2VASc score of 1(age)  Has missed several doses of eliquis He  has been compliant for the last 2 weeks and will have CV scheduled after 2/2.   Reminded not to miss any anticoagulation  Risk vrs benefit of cardioversion discussed  Cbc/bmet/covid   F/u in afib clinic in one week   Butch Penny C. Marlyn Tondreau, [facility] [address] [place], [place] [address] [phone]

## 2020-10-12 ENCOUNTER — Other Ambulatory Visit (HOSPITAL_COMMUNITY)
Admission: RE | Admit: 2020-10-12 | Discharge: 2020-10-12 | Disposition: A | Discharge status: Home / Self Care | Payer: Medicare PPO | Source: Ambulatory Visit | Attending: Internal Medicine | Admitting: Internal Medicine

## 2020-10-12 DIAGNOSIS — Z20822 Contact with and (suspected) exposure to covid-19: Secondary | ICD-10-CM | POA: Diagnosis not present

## 2020-10-12 DIAGNOSIS — Z01812 Encounter for preprocedural laboratory examination: Secondary | ICD-10-CM | POA: Diagnosis present

## 2020-10-12 LAB — SARS CORONAVIRUS 2 (TAT 6-24 HRS): SARS Coronavirus 2: NEGATIVE

## 2020-10-14 ENCOUNTER — Encounter (HOSPITAL_COMMUNITY): Payer: Self-pay | Admitting: Internal Medicine

## 2020-10-14 ENCOUNTER — Ambulatory Visit (HOSPITAL_COMMUNITY)
Admission: RE | Admit: 2020-10-14 | Discharge: 2020-10-14 | Disposition: A | Discharge status: Home / Self Care | Payer: Medicare PPO | Source: Ambulatory Visit | Attending: Internal Medicine | Admitting: Internal Medicine

## 2020-10-14 ENCOUNTER — Ambulatory Visit (HOSPITAL_COMMUNITY): Payer: Medicare PPO | Admitting: Certified Registered Nurse Anesthetist

## 2020-10-14 ENCOUNTER — Encounter (HOSPITAL_COMMUNITY)
Admission: RE | Disposition: A | Discharge status: Home / Self Care | Payer: Self-pay | Source: Ambulatory Visit | Attending: Internal Medicine

## 2020-10-14 ENCOUNTER — Other Ambulatory Visit: Payer: Self-pay

## 2020-10-14 DIAGNOSIS — I4891 Unspecified atrial fibrillation: Secondary | ICD-10-CM | POA: Insufficient documentation

## 2020-10-14 DIAGNOSIS — Z87891 Personal history of nicotine dependence: Secondary | ICD-10-CM | POA: Diagnosis not present

## 2020-10-14 DIAGNOSIS — I4819 Other persistent atrial fibrillation: Secondary | ICD-10-CM | POA: Diagnosis not present

## 2020-10-14 DIAGNOSIS — Z7901 Long term (current) use of anticoagulants: Secondary | ICD-10-CM | POA: Diagnosis not present

## 2020-10-14 DIAGNOSIS — Z79899 Other long term (current) drug therapy: Secondary | ICD-10-CM | POA: Insufficient documentation

## 2020-10-14 HISTORY — PX: CARDIOVERSION: SHX1299

## 2020-10-14 SURGERY — CARDIOVERSION
Anesthesia: General

## 2020-10-14 MED ORDER — PROPOFOL 10 MG/ML IV BOLUS
INTRAVENOUS | Status: DC | PRN
  Administered 2020-10-14: 60 mg via INTRAVENOUS
  Administered 2020-10-14: 40 mg via INTRAVENOUS

## 2020-10-14 MED ORDER — LIDOCAINE 2% (20 MG/ML) 5 ML SYRINGE
INTRAMUSCULAR | Status: DC | PRN
  Administered 2020-10-14: 100 mg via INTRAVENOUS

## 2020-10-14 MED ORDER — SODIUM CHLORIDE 0.9 % IV SOLN: INTRAVENOUS | Status: DC | PRN

## 2020-10-14 NOTE — Anesthesia Preprocedure Evaluation (Signed)
Anesthesia Evaluation  Patient identified by MRN, date of birth, ID band Patient awake    Reviewed: Allergy & Precautions, NPO status , Patient's Chart, lab work & pertinent test results, reviewed documented beta blocker date and time   History of Anesthesia Complications Negative for: history of anesthetic complications  Airway Mallampati: II  TM Distance: >3 FB Neck ROM: Full    Dental   Pulmonary neg pulmonary ROS,    Pulmonary exam normal        Cardiovascular hypertension, Pt. on medications and Pt. on home beta blockers + dysrhythmias Atrial Fibrillation  Rhythm:Irregular Rate:Bradycardia     Neuro/Psych negative neurological ROS  negative psych ROS   GI/Hepatic negative GI ROS, Neg liver ROS,   Endo/Other  negative endocrine ROS  Renal/GU negative Renal ROS  negative genitourinary   Musculoskeletal  (+) Arthritis ,   Abdominal   Peds  Hematology negative hematology ROS (+)   Anesthesia Other Findings   Reproductive/Obstetrics                             Anesthesia Physical Anesthesia Plan  ASA: III  Anesthesia Plan: General   Post-op Pain Management:    Induction: Intravenous  PONV Risk Score and Plan: 2 and TIVA and Treatment may vary due to age or medical condition  Airway Management Planned: Mask  Additional Equipment: None  Intra-op Plan:   Post-operative Plan:   Informed Consent: I have reviewed the patients History and Physical, chart, labs and discussed the procedure including the risks, benefits and alternatives for the proposed anesthesia with the patient or authorized representative who has indicated his/her understanding and acceptance.       Plan Discussed with:   Anesthesia Plan Comments:         Anesthesia Quick Evaluation

## 2020-10-14 NOTE — Interval H&P Note (Signed)
History and Physical Interval Note:  10/14/2020 11:40 AM  [name] [name]  has presented today for surgery, with the diagnosis of AFIB.  The various methods of treatment have been discussed with the patient and family. After consideration of risks, benefits and other options for treatment, the patient has consented to  Procedure(s): CARDIOVERSION (N/A) as a surgical intervention.  The patient's history has been reviewed, patient examined, no change in status, stable for surgery.  I have reviewed the patient's chart and labs.  Questions were answered to the patient's satisfaction.     [name] [name]

## 2020-10-14 NOTE — Anesthesia Postprocedure Evaluation (Signed)
Anesthesia Post Note  Patient: [name] [name]  Procedure(s) Performed: CARDIOVERSION (N/A )     Patient location during evaluation: Endoscopy Anesthesia Type: General Level of consciousness: awake and alert Pain management: pain level controlled Vital Signs Assessment: post-procedure vital signs reviewed and stable Respiratory status: spontaneous breathing, nonlabored ventilation and respiratory function stable Cardiovascular status: blood pressure returned to baseline and stable Postop Assessment: no apparent nausea or vomiting Anesthetic complications: no   No complications documented.  Last Vitals:  Vitals:   10/14/20 1348 10/14/20 1400  BP: (!) 148/70   Pulse: (!) 48 (!) 43  Resp: 18 17  Temp:    SpO2: 97% 98%    Last Pain:  Vitals:   10/14/20 1348  TempSrc:   PainSc: 0-No pain                 Lidia Collum

## 2020-10-14 NOTE — Transfer of Care (Signed)
Immediate Anesthesia Transfer of Care Note  Patient: [name] [name]  Procedure(s) Performed: CARDIOVERSION (N/A )  Patient Location: Endoscopy Unit  Anesthesia Type:General  Level of Consciousness: drowsy  Airway & Oxygen Therapy: Patient Spontanous Breathing  Post-op Assessment: Report given to RN and Post -op Vital signs reviewed and stable  Post vital signs: Reviewed and stable  Last Vitals:  Vitals Value Taken Time  BP 169/71 10/14/20 1256  Temp    Pulse 98 10/14/20 1256  Resp 21 10/14/20 1256  SpO2 93 % 10/14/20 1256    Last Pain:  Vitals:   10/14/20 1131  TempSrc: Tympanic  PainSc: 0-No pain         Complications: No complications documented.

## 2020-10-14 NOTE — Discharge Instructions (Signed)
Electrical Cardioversion Electrical cardioversion is the delivery of a jolt of electricity to restore a normal rhythm to the heart. A rhythm that is too fast or is not regular keeps the heart from pumping well. In this procedure, sticky patches or metal paddles are placed on the chest to deliver electricity to the heart from a device. This procedure may be done in an emergency if:  There is low or no blood pressure as a result of the heart rhythm.  Normal rhythm must be restored as fast as possible to protect the brain and heart from further damage.  It may save a life. This may also be a scheduled procedure for irregular or fast heart rhythms that are not immediately life-threatening. Tell a health care provider about:  Any allergies you have.  All medicines you are taking, including vitamins, herbs, eye drops, creams, and over-the-counter medicines.  Any problems you or family members have had with anesthetic medicines.  Any blood disorders you have.  Any surgeries you have had.  Any medical conditions you have.  Whether you are pregnant or may be pregnant. What are the risks? Generally, this is a safe procedure. However, problems may occur, including:  Allergic reactions to medicines.  A blood clot that breaks free and travels to other parts of your body.  The possible return of an abnormal heart rhythm within hours or days after the procedure.  Your heart stopping (cardiac arrest). This is rare. What happens before the procedure? Medicines  Your health care provider may have you start taking: ? Blood-thinning medicines (anticoagulants) so your blood does not clot as easily. ? Medicines to help stabilize your heart rate and rhythm.  Ask your health care provider about: ? Changing or stopping your regular medicines. This is especially important if you are taking diabetes medicines or blood thinners. ? Taking medicines such as aspirin and ibuprofen. These medicines can  thin your blood. Do not take these medicines unless your health care provider tells you to take them. ? Taking over-the-counter medicines, vitamins, herbs, and supplements. General instructions  Follow instructions from your health care provider about eating or drinking restrictions.  Plan to have someone take you home from the hospital or clinic.  If you will be going home right after the procedure, plan to have someone with you for 24 hours.  Ask your health care provider what steps will be taken to help prevent infection. These may include washing your skin with a germ-killing soap. What happens during the procedure?  An IV will be inserted into one of your veins.  Sticky patches (electrodes) or metal paddles may be placed on your chest.  You will be given a medicine to help you relax (sedative).  An electrical shock will be delivered. The procedure may vary among health care providers and hospitals.   What can I expect after the procedure?  Your blood pressure, heart rate, breathing rate, and blood oxygen level will be monitored until you leave the hospital or clinic.  Your heart rhythm will be watched to make sure it does not change.  You may have some redness on the skin where the shocks were given. Follow these instructions at home:  Do not drive for 24 hours if you were given a sedative during your procedure.  Take over-the-counter and prescription medicines only as told by your health care provider.  Ask your health care provider how to check your pulse. Check it often.  Rest for 48 hours after the procedure   or as told by your health care provider.  Avoid or limit your caffeine use as told by your health care provider.  Keep all follow-up visits as told by your health care provider. This is important. Contact a health care provider if:  You feel like your heart is beating too quickly or your pulse is not regular.  You have a serious muscle cramp that does not go  away. Get help right away if:  You have discomfort in your chest.  You are dizzy or you feel faint.  You have trouble breathing or you are short of breath.  Your speech is slurred.  You have trouble moving an arm or leg on one side of your body.  Your fingers or toes turn cold or blue. Summary  Electrical cardioversion is the delivery of a jolt of electricity to restore a normal rhythm to the heart.  This procedure may be done right away in an emergency or may be a scheduled procedure if the condition is not an emergency.  Generally, this is a safe procedure.  After the procedure, check your pulse often as told by your health care provider. This information is not intended to replace advice given to you by your health care provider. Make sure you discuss any questions you have with your health care provider. Document Revised: 03/31/2019 Document Reviewed: 03/31/2019 Elsevier Patient Education  2021 Elsevier Inc.  

## 2020-10-14 NOTE — Anesthesia Procedure Notes (Signed)
Procedure Name: General with mask airway Date/Time: 10/14/2020 12:48 PM Performed by: Colin Benton, CRNA Pre-anesthesia Checklist: Patient identified, Emergency Drugs available, Suction available and Patient being monitored Patient Re-evaluated:Patient Re-evaluated prior to induction Oxygen Delivery Method: Ambu bag Preoxygenation: Pre-oxygenation with 100% oxygen Induction Type: IV induction Placement Confirmation: positive ETCO2 Dental Injury: Teeth and Oropharynx as per pre-operative assessment

## 2020-10-14 NOTE — CV Procedure (Signed)
Procedure: Electrical Cardioversion Indications:  Atrial Fibrillation  Procedure Details:  Consent: Risks of procedure as well as the alternatives and risks of each were explained to the (patient/caregiver).  Consent for procedure obtained.  Time Out: Verified patient identification, verified procedure, site/side was marked, verified correct patient position, special equipment/implants available, medications/allergies/relevent history reviewed, required imaging and test results available. PERFORMED.  Patient placed on cardiac monitor, pulse oximetry, supplemental oxygen as necessary.  Sedation given: propofol per anesthesia Pacer pads placed anterior and posterior chest.  Cardioverted 4 time(s).  Cardioversion with synchronized biphasic 120J x 1, 200J x 3 shock.  Evaluation: Findings: Post procedure EKG shows: Atrial Flutter Complications: None Patient did tolerate procedure well.  Time Spent Directly with the Patient:  30 minutes   Elouise Munroe 10/14/2020, 12:58 PM

## 2020-10-21 ENCOUNTER — Encounter (HOSPITAL_COMMUNITY): Payer: Self-pay | Admitting: Nurse Practitioner

## 2020-10-21 ENCOUNTER — Other Ambulatory Visit: Payer: Self-pay

## 2020-10-21 ENCOUNTER — Ambulatory Visit (HOSPITAL_COMMUNITY)
Admission: RE | Admit: 2020-10-21 | Discharge: 2020-10-21 | Disposition: A | Discharge status: Home / Self Care | Payer: Medicare PPO | Source: Ambulatory Visit | Attending: Nurse Practitioner | Admitting: Nurse Practitioner

## 2020-10-21 VITALS — BP 138/86 | HR 163 | Ht 76.0 in | Wt 285.6 lb

## 2020-10-21 DIAGNOSIS — Z79899 Other long term (current) drug therapy: Secondary | ICD-10-CM | POA: Insufficient documentation

## 2020-10-21 DIAGNOSIS — I4819 Other persistent atrial fibrillation: Secondary | ICD-10-CM

## 2020-10-21 DIAGNOSIS — I4891 Unspecified atrial fibrillation: Secondary | ICD-10-CM | POA: Insufficient documentation

## 2020-10-21 DIAGNOSIS — Z7901 Long term (current) use of anticoagulants: Secondary | ICD-10-CM | POA: Insufficient documentation

## 2020-10-21 DIAGNOSIS — I4892 Unspecified atrial flutter: Secondary | ICD-10-CM | POA: Insufficient documentation

## 2020-10-21 DIAGNOSIS — D6869 Other thrombophilia: Secondary | ICD-10-CM | POA: Diagnosis not present

## 2020-10-21 NOTE — Progress Notes (Addendum)
Primary Care Physician: [name], MD Referring Physician: Dr. Shirleen Schirmer [name] [name] is a 75 y.o. male with a h/o new onset  afib that is in the afib clinic  for f/u from Dr. Johnsie Cancel where he was started on eliquis.around 4 weeks ago. He was found to be in afib for pre op w/u for prostate surgery. Unfortunately, I can not set pt up for cardioversion  today as he has missed several doses of eliquis. He is asymptomatic, so we will start the 3 weeks over again today. He is rate controlled.   F/u in afib clinic 10/06/20. He has not missed any anticoagulation for the last 2 weeks. Will set up cardioversion for after 2/2. He still is tolerating afib well. He is rate controlled.   F/u in afib clinic, 10/31/20. Unfortunately, he did not convert with CV. In fact,  he went from afib to atrial flutter rate controlled. In the office today, feels fine and has no symptoms from  being out of rhythm. Discussed options and he and his wife, are leaning  to continue with rate control. He has left atrium size of 5.10 cm so I worry how easy it may be to restore and maintain SR.  I feel Multaq would not be of a sufficient strength to convert pt. I have some concerns with flecainide with IRBBB at baseline in afib and echo shows LVH with posterior wall  thickness of 1.40 He  has not had a stress test in the past and if flecainide is considered then he will need a stress test. He has concerns with amiodarone side effects and does not want hospitalization to load tikosyn.   Today, he denies symptoms of palpitations, chest pain, shortness of breath, orthopnea, PND, lower extremity edema, dizziness, presyncope, syncope, or neurologic sequela. The patient is tolerating medications without difficulties and is otherwise without complaint today.   Past Medical History:  Diagnosis Date   A-fib [facility])    Arthritis    Frequency of urination    Hematuria    History of gout    History of kidney stones     Hypertension    Pneumonia 1981   History of   Prostate cancer ([place])    Right ureteral stone    Urgency of urination    Past Surgical History:  Procedure Laterality Date   ACHILLES TENDON SURGERY Left    CARDIOVERSION N/A 10/14/2020   Procedure: CARDIOVERSION;  Surgeon: Elouise Munroe, MD;  Location: [place];  Service: Cardiovascular;  Laterality: N/A;   CHOLECYSTECTOMY     CYST EXCISION     finger   CYSTO/  RETROGRADE PYELOGRAM/ RIGHT URETERAL PLACEMENT  04-04-2013   CYSTOSCOPY WITH RETROGRADE PYELOGRAM, URETEROSCOPY AND STENT PLACEMENT Right 04/04/2013   Procedure: CYSTOSCOPY WITH RIGHT RETROGRADE PYELOGRAM, AND RIGHT STENT PLACEMENT, DIAGNOSTIC RIGHT URETEROSCOPY;  Surgeon: Alexis Frock, MD;  Location: [facility];  Service: Urology;  Laterality: Right;   CYSTOSCOPY WITH URETEROSCOPY AND STENT PLACEMENT Right 05/07/2013   Procedure: CYSTOSCOPY WITH URETEROSCOPY AND STENT PLACEMENT;  Surgeon: Alexis Frock, MD;  [facility];  Service: Urology;  Laterality: Right;   HOLMIUM LASER APPLICATION Right 01/11/7740   Procedure: HOLMIUM LASER APPLICATION;  Surgeon: Alexis Frock, MD;  Location: [facility];  Service: Urology;  Laterality: Right;   KNEE ARTHROSCOPY W/ MENISCECTOMY Right    LAPAROSCOPIC CHOLECYSTECTOMY  2003   LYMPHADENECTOMY Bilateral 07/21/2020   Procedure: LYMPHADENECTOMY;  Surgeon: Alexis Frock, MD;  Location: [facility];  Service: Urology;  Laterality: Bilateral;   ROBOT ASSISTED LAPAROSCOPIC RADICAL PROSTATECTOMY N/A 07/21/2020   Procedure: XI ROBOTIC ASSISTED LAPAROSCOPIC RADICAL PROSTATECTOMY, BILATERAL INGUINAL HERNIA REPAIR;  Surgeon: Alexis Frock, MD;  Location: [facility];  Service: Urology;  Laterality: N/A;  3 HRS   TOTAL KNEE ARTHROPLASTY  10/08/2012   Procedure: TOTAL KNEE ARTHROPLASTY;  Surgeon: Sydnee Cabal, MD;  Location: [facility];  Service: Orthopedics;  Laterality: Right;    Current Outpatient  Medications  Medication Sig Dispense Refill   acetaminophen (TYLENOL) 500 MG tablet Take 1,000 mg by mouth every 6 (six) hours as needed for moderate pain or headache.     allopurinol (ZYLOPRIM) 100 MG tablet Take 100 mg by mouth in the morning and at bedtime.     apixaban (ELIQUIS) 5 MG TABS tablet Take 1 tablet (5 mg total) by mouth 2 (two) times daily. 60 tablet 11   BACTRIM DS 800-160 MG tablet Take 3-4 tablets by mouth See admin instructions. For dental procedures Take 3-4 tablets by mouth 30 mins prior to dental work     fluticasone (FLONASE) 50 MCG/ACT nasal spray Place 1 spray into both nostrils daily as needed (sinus/allergies.).     metoprolol succinate (TOPROL-XL) 25 MG 24 hr tablet Take 25 mg by mouth daily.     Multiple Vitamins-Minerals (AIRBORNE GUMMIES PO) Chews 2 gummies by mouth daily     No current facility-administered medications for this encounter.    No Known Allergies  Social History   Socioeconomic History   Marital status: Married    Spouse name: Not on file   Number of children: Not on file   Years of education: Not on file   Highest education level: Not on file  Occupational History   Not on file  Tobacco Use   Smoking status: Never Smoker   Smokeless tobacco: Former Systems developer    Types: Nurse, children's Use: Never used  Substance and Sexual Activity   Alcohol use: Yes    Comment: social   Drug use: No   Sexual activity: Not on file  Other Topics Concern   Not on file  Social History Narrative   ** Merged History Encounter **       Social Determinants of Health   Financial Resource Strain: Not on file  Food Insecurity: Not on file  Transportation Needs: Not on file  Physical Activity: Not on file  Stress: Not on file  Social Connections: Not on file  Intimate Partner Violence: Not on file    No family history on file.  ROS- All systems are reviewed and negative except as per the HPI above  Physical  Exam: Vitals:   10/21/20 1404  BP: 138/86  Pulse: (!) 163  Weight: 129.5 kg  Height: 6\' 4"  (1.93 m)   Wt Readings from Last 3 Encounters:  10/21/20 129.5 kg  10/14/20 126.1 kg  10/06/20 129.9 kg    Labs: Lab Results  Component Value Date   NA 142 10/06/2020   K 4.2 10/06/2020   CL 105 10/06/2020   CO2 29 10/06/2020   GLUCOSE 97 10/06/2020   BUN 15 10/06/2020   CREATININE 1.19 10/06/2020   CALCIUM 9.5 10/06/2020   Lab Results  Component Value Date   INR 1.00 10/01/2012   No results found for: CHOL, HDL, LDLCALC, TRIG   GEN- The patient is well appearing, alert and oriented x 3 today.   Head- normocephalic, atraumatic Eyes-  Sclera clear, conjunctiva pink Ears- hearing intact Oropharynx- clear Neck- supple, no JVP Lymph- no cervical lymphadenopathy Lungs- Clear to ausculation bilaterally, normal work of breathing Heart- irregular rate and rhythm, no murmurs, rubs or gallops, PMI not laterally displaced GI- soft, NT, ND, + BS Extremities- no clubbing, cyanosis, or edema MS- no significant deformity or atrophy Skin- no rash or lesion Psych- euthymic mood, full affect Neuro- strength and sensation are intact  EKG-afib at 64 bpm, IRBBB, qrs int 98 ms, qtc 433 ms   Echo-1. Left ventricular ejection fraction, by estimation, is 55 to 60%. The  left ventricle has normal function. The left ventricle has no regional  wall motion abnormalities. There is moderate concentric left ventricular  hypertrophy. Left ventricular  diastolic parameters are indeterminate.  2. Right ventricular systolic function is normal. The right ventricular  size is normal.  3. Left atrial size was mildly dilated.( note- LA size is 5.10 ms with LA volume of 99, to me indicates severe enlargement)    4. The mitral valve is normal in structure. Trivial mitral valve  regurgitation.  5. The aortic valve is tricuspid. Aortic valve regurgitation is mild.  6. Aortic dilatation noted. There is  mild dilatation of the aortic root,  measuring 40 mm. There is moderate dilatation of the ascending aorta,  measuring 45 mm.  7. The inferior vena cava is normal in size with greater than 50%  respiratory variability, suggesting right atrial pressure of 3 mmHg.   Comparison(s): No prior Echocardiogram.   FINDINGS  Left Ventricle: Left ventricular ejection fraction, by estimation, is 55  to 60%. The left ventricle has normal function. The left ventricle has no  regional wall motion abnormalities. Definity contrast agent was given IV  to delineate the left ventricular  endocardial borders. The left ventricular internal cavity size was normal  in size. There is moderate concentric left ventricular hypertrophy. Left  ventricular diastolic parameters are indeterminate.   Right Ventricle: The right ventricular size is normal. Right vetricular  wall thickness was not well visualized. Right ventricular systolic  function is normal.   Left Atrium: Left atrial size was mildly dilated.   Right Atrium: Right atrial size was normal in size.   Pericardium: There is no evidence of pericardial effusion.    Assessment and Plan: 1. New onset afib Nov 2021 Failed cardioversion with afib going to  atrial flutter  Pt is asymptomatic and rate controlled Discussed options to restore SR Pt is leaning toward rate as he does not feel bad  Continue toprol 25 mg daily   2. CHA2DS2VASc score of 1(age)  Continue eliquis 5 mg bid    I will discuss further with Dr. Rayann Heman and then discuss again with pt   10/22/20- discussed with Dr. Rayann Heman . He is in agreement that if pt is asymptomatic,and well rate controlled,  he may continue with rate control as his treatment strategy. Pt informed and I will request f/u with Dr. Johnsie Cancel in 2-3 months.   Geroge Baseman Daimian Sudberry, [facility] [address] [place], [place] [address] [phone]

## 2020-10-22 NOTE — Addendum Note (Signed)
Encounter addended by: Sherran Needs, NP on: 10/22/2020 2:18 PM  Actions taken: Clinical Note Signed

## 2020-12-09 ENCOUNTER — Telehealth: Payer: Self-pay | Admitting: Cardiovascular Disease

## 2020-12-09 NOTE — Telephone Encounter (Signed)
1. What dental office are you calling from? [facility]  2. What is your office phone number? [phone]   3. What is your fax number? [phone]  4. What type of procedure is the patient having performed? Extraction, osseous surgery  5. What date is procedure scheduled or is the patient there now? 12/21/2020 (if the patient is at the dentist's office question goes to their cardiologist if he/she is in the office.  If not, question should go to the DOD).   6. What is your question (ex. Antibiotics prior to procedure, holding medication-we need to know how long dentist wants pt to hold med)? Dr. Geralynn Ochs would like to know whether they need to hold eliquis

## 2020-12-10 NOTE — Telephone Encounter (Signed)
Refaxed per requesting office request-Simple dental extractions are considered low risk procedures per guidelines and generally do not require any specific cardiac clearance. It is also generally accepted that for simple extractions and dental cleanings, there is no need to interrupt blood thinner therapy.    SBE prophylaxis is not required for the patient.

## 2020-12-10 NOTE — Telephone Encounter (Signed)
Please contact requesting office for details surrounding oral surgery.  How many extractions will be performed and what type of anesthesia will be used.  Thank you.

## 2020-12-10 NOTE — Telephone Encounter (Signed)
Called Dr Jackson Latino office and s/w Elnita Maxwell and she states that pt's periodontal surgery this will include opening up 'flap" and deep cleaning and extracting 1 tooth the right first then another appt to do the same on the left. Anesthesia local and 2% lido w/100,000 EPI.

## 2020-12-10 NOTE — Telephone Encounter (Signed)
   Primary Cardiologist: Jenkins Rouge, MD  Chart reviewed as part of pre-operative protocol coverage. Simple dental extractions are considered low risk procedures per guidelines and generally do not require any specific cardiac clearance. It is also generally accepted that for simple extractions and dental cleanings, there is no need to interrupt blood thinner therapy.   SBE prophylaxis is not required for the patient.  I will route this recommendation to the requesting party via Epic fax function and remove from pre-op pool.  Please call with questions.  Deberah Pelton, NP 12/10/2020, 9:30 AM

## 2021-08-30 ENCOUNTER — Other Ambulatory Visit: Payer: Self-pay | Admitting: Cardiovascular Disease

## 2021-08-30 NOTE — Telephone Encounter (Signed)
Pt last saw Roderic Palau, NP on 10/21/20, last labs 06/21/21 Creat 1.18, age 75, weight 129.5kg, based on specified criteria pt is on appropriate dosage of Eliquis 5mg  BID for afib.  Will refill rx.

## 2021-09-28 DIAGNOSIS — E559 Vitamin D deficiency, unspecified: Secondary | ICD-10-CM | POA: Insufficient documentation

## 2022-01-26 ENCOUNTER — Ambulatory Visit: Payer: Self-pay

## 2022-01-26 ENCOUNTER — Ambulatory Visit: Payer: Medicare PPO | Admitting: Sports Medicine

## 2022-01-26 VITALS — BP 150/98 | Ht 76.0 in | Wt 285.0 lb

## 2022-01-26 DIAGNOSIS — M549 Dorsalgia, unspecified: Secondary | ICD-10-CM | POA: Diagnosis not present

## 2022-01-26 MED ORDER — GABAPENTIN 300 MG PO CAPS: 300.0000 mg | ORAL_CAPSULE | Freq: Every evening | ORAL | 0 refills | Status: DC | PRN
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
## 2022-01-27 NOTE — Addendum Note (Signed)
Addended by: Lilia Argue R on: 01/27/2022 10:04 AM   Modules accepted: Level of Service

## 2022-02-14 ENCOUNTER — Other Ambulatory Visit: Payer: Self-pay | Admitting: Cardiovascular Disease

## 2022-02-14 NOTE — Telephone Encounter (Signed)
Prescription refill request for Eliquis received. Indication: Atrial Fib Last office visit: 10/21/20  Maximino Greenland NP Scr: 1.18 on 06/21/21 Age:  76 Weight: 129.5kg  Based on above findings Eliquis '5mg'$  twice daily is the appropriate dose.  Pt is past due for MD appt.  Message sent to schedulers to make appt.  Refill approved x 1.

## 2022-02-25 ENCOUNTER — Other Ambulatory Visit: Payer: Self-pay | Admitting: Sports Medicine
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
## 2022-03-08 ENCOUNTER — Ambulatory Visit: Payer: Medicare PPO | Admitting: Cardiovascular Disease

## 2022-03-08 VITALS — BP 146/82 | HR 54 | Ht 76.0 in | Wt 296.0 lb

## 2022-03-08 DIAGNOSIS — I10 Essential (primary) hypertension: Secondary | ICD-10-CM | POA: Diagnosis not present

## 2022-03-08 DIAGNOSIS — I712 Thoracic aortic aneurysm, without rupture, unspecified: Secondary | ICD-10-CM

## 2022-03-08 DIAGNOSIS — I4819 Other persistent atrial fibrillation: Secondary | ICD-10-CM

## 2022-03-08 MED ORDER — APIXABAN 5 MG PO TABS: 5.0000 mg | ORAL_TABLET | Freq: Two times a day (BID) | ORAL | 3 refills | Status: DC

## 2022-03-08 MED ORDER — METOPROLOL SUCCINATE ER 25 MG PO TB24: 25.0000 mg | ORAL_TABLET | Freq: Every day | ORAL | 3 refills | Status: DC

## 2022-03-08 MED ORDER — VALSARTAN 80 MG PO TABS: 80.0000 mg | ORAL_TABLET | Freq: Every day | ORAL | 3 refills | Status: DC

## 2022-03-08 NOTE — Patient Instructions (Addendum)
Medication Instructions:  Your physician recommends that you continue on your current medications as directed. Please refer to the Current Medication list given to you today.  *If you need a refill on your cardiac medications before your next appointment, please call your pharmacy*  Lab Work: If you have labs (blood work) drawn today and your tests are completely normal, you will receive your results only by: MyChart Message (if you have MyChart) OR A paper copy in the mail If you have any lab test that is abnormal or we need to change your treatment, we will call you to review the results.  Follow-Up: At [facility], you and your health needs are our priority.  As part of our continuing mission to provide you with exceptional heart care, we have created designated Provider Care Teams.  These Care Teams include your primary Cardiologist (physician) and Advanced Practice Providers (APPs -  Physician Assistants and Nurse Practitioners) who all work together to provide you with the care you need, when you need it.  We recommend signing up for the patient portal called "MyChart".  Sign up information is provided on this After Visit Summary.  MyChart is used to connect with patients for Virtual Visits (Telemedicine).  Patients are able to view lab/test results, encounter notes, upcoming appointments, etc.  Non-urgent messages can be sent to your provider as well.   To learn more about what you can do with MyChart, go to [email or web address].    Your next appointment:   6 month(s)  The format for your next appointment:   In Person  Provider:   Peter Nishan, MD {  Important Information About Sugar       

## 2022-03-24 ENCOUNTER — Telehealth: Payer: Self-pay | Admitting: Cardiovascular Disease

## 2022-03-24 ENCOUNTER — Other Ambulatory Visit: Payer: Self-pay | Admitting: Urology

## 2022-03-24 NOTE — Telephone Encounter (Signed)
    Pre-operative Risk Assessment    Patient Name: [name] [name]  DOB: Oct 17, 1945 MRN: [id number]      Request for Surgical Clearance    Procedure:   cysto with botox   Date of Surgery:  Clearance 05/02/22                                 Surgeon:  Dr. Matilde Sprang Surgeon's Group or Practice Name:  [facility] Phone number:  [phone] Fax number:  [phone]   Type of Clearance Requested:   - Medical  - Pharmacy:  Hold Apixaban (Eliquis) 2 days prior   Type of Anesthesia:  General    Additional requests/questions:    Signed, Selinda Orion   03/24/2022, 4:50 PM

## 2022-03-27 ENCOUNTER — Other Ambulatory Visit: Payer: Self-pay | Admitting: Urology

## 2022-03-28 NOTE — Telephone Encounter (Signed)
Patient with diagnosis of afib on Eliquis for anticoagulation.    Procedure: cystoscopy with botox Date of procedure: 05/02/22  CHA2DS2-VASc Score = 2  This indicates a 2.2% annual risk of stroke. The patient's score is based upon: CHF History: 0 HTN History: 1 Diabetes History: 0 Stroke History: 0 Vascular Disease History: 0 Age Score: 1 Gender Score: 0   CrCl 72m/min using adjusted body weight due to obesity Platelet count 196K  Per office protocol, patient can hold Eliquis for 2 days prior to procedure.    **This guidance is not considered finalized until pre-operative APP has relayed final recommendations.**

## 2022-03-29 NOTE — Telephone Encounter (Signed)
   Name: [name] [name]  DOB: [birth year]  MRN: [id number]   Primary Cardiologist: Jenkins Rouge, MD  Chart reviewed as part of pre-operative protocol coverage. Patient was contacted 03/29/2022 in reference to pre-operative risk assessment for pending surgery as outlined below.  [name] [name] was last seen on 6/28/223 by Dr. Johnsie Cancel.  Since that day, [name] [name] has done from a cardiac standpoint.  He denies any new symptoms or concerns. He is able to complete > 4 METS.   Therefore, based on ACC/AHA guidelines, the patient would be at acceptable risk for the planned procedure without further cardiovascular testing.   The patient was advised that if he develops new symptoms prior to surgery to contact our office to arrange for a follow-up visit, and he verbalized understanding.  Patient with diagnosis of afib on Eliquis for anticoagulation.     Procedure: cystoscopy with botox Date of procedure: 05/02/22   CHA2DS2-VASc Score = 2  This indicates a 2.2% annual risk of stroke. The patient's score is based upon: CHF History: 0 HTN History: 1 Diabetes History: 0 Stroke History: 0 Vascular Disease History: 0 Age Score: 1 Gender Score: 0   CrCl 68m/min using adjusted body weight due to obesity Platelet count 196K   Per office protocol, patient can hold Eliquis for 2 days prior to procedure.  Please resume Eliquis as soon as possible postprocedure, at the discretion of the surgeon.  I will route this recommendation to the requesting party via Epic fax function and remove from pre-op pool. Please call with questions.  ELenna Sciara NP 03/29/2022, 12:38 PM

## 2022-04-25 ENCOUNTER — Encounter (HOSPITAL_BASED_OUTPATIENT_CLINIC_OR_DEPARTMENT_OTHER): Payer: Self-pay | Admitting: Urology

## 2022-04-25 ENCOUNTER — Other Ambulatory Visit: Payer: Self-pay

## 2022-04-25 NOTE — Progress Notes (Signed)
Spoke w/ via phone for pre-op interview---pt Lab needs dos---- I stat              Lab results------ COVID test -----patient states asymptomatic no test needed Arrive at -------530 am 05-02-2022 NPO after MN NO Solid Food.  Clear liquids from MN until---430 am Med rec completed Medications to take morning of surgery -----metoprolol succinate, allopurinol Diabetic medication -----n/a Patient instructed no nail polish to be worn day of surgery Patient instructed to bring photo id and insurance card day of surgery Patient aware to have Driver (ride ) / caregiver   wife [name] [name]  for 24 hours after surgery  Patient Special Instructions -----none Pre-Op special Istructions -----cardiac clearance/note to hold eliquis 2 days before surgery emily monge no dated 03-24-2022 chart/epic (patient aware last dose to be 04-29-2022) Patient verbalized understanding of instructions that were given at this phone interview. Patient denies shortness of breath, chest pain, fever, cough at this phone interview.   [facility] cardiology  dr Johnsie Cancel 03-08-2022 epic Ekg 03-08-2022 chart/epic Echo 07-16-2020 epic

## 2022-04-28 NOTE — H&P (View-Only) (Signed)
I was consulted to assist the patient for urinary incontinence. He had a robotic prostatectomy 2021. PSA has been normal. 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 Has seen physical therapy. Has tried a penile clamp.   The patient leaks with coughing sneezing and worse with activity. He has urge incontinence. He has high-volume bedwetting. He leaks without awareness. He can soak up to 8 pads a day   He is not voiding very much most of the urine is in the pad. He may void a small amount every few hours and change his pad every 2 or 3 hours. He gets up 2 or 3 times a night. I really could not quantitate the strength of the flow   It appears he had 2 hernia repairs at the time of his surgery in the inguinal area with no mesh. 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 He has a moderate suprapubic fat pad. I did not see an inguinal incisions. He had a right pustule near the right inguinal area that would need to be cleared prior to any surgery. He was dripping in the standing position and had a negative cough test but his bladder may have been empty. Normal scrotal size.   Patient did not void prior to the study and was catheterized for a few milliliters. Maximum bladder capacity was 130 mL. Increased bladder sensation. He did very impressive bladder overactivity reaching pressures of 34 cm of water. He felt in his urgency and burning and leaked a mild to moderate amount with the contractions. There were multiple contractions provoked and unprovoked triggered by coughing and Valsalva. They were very repetitive almost like a sine wave. He had no stress incontinence with a Valsalva pressure of 84 cm of water he would only leak when he did provoke a contraction. During voluntary voiding he voided 113 mL with a max of flow of 5 mils per second. He emptied efficiently. He did not generate a detrusor contraction EMG  activity decreased during voiding. He had an oblong shape bladder and perhaps some funneling of the bladder neck.   I was surprised by the results of the test. When I do a cystoscopy in the future I will also assess him for stress incontinence supine and standing. I will try to help his overactive bladder first. Whether to do an artificial sphincter or third line overactive bladder treatment would need to be discussed. Ongoing symptoms with an artificial sphincter are definite possibility. I will continue to look for objective signs of stress incontinence. Reassess in 5 weeks on Myrbetriq 50 mg samples and prescription in time and schedule voiding   Eventually he will need a cystoscopy   Today  Incontinence the same are a bit worse. Frequency stable.  See for cystoscopy in 5 weeks on Gemtesa. Recheck skin for any skin issues. Proceed accordingly. Clinically not infected  I will try to assess bladder capacity on bladder filling and have him stand and check for stress incontinence. A graduate will also be helpful in trying to assess functional capacity   TOday  Symptoms were worse on the Gemtesa. The pustule in the right groin is gone but now he has urinary dermatitis with likely yeast in both inguinal areas worse on the left than right  Concealed penis.   Patient underwent flexible cystoscopy. The penile bulbar urethra normal. Bladder neck open. No foreign body. He appeared  to have a very small bladder capacity and he had sensory urgency quite quickly. Trigone normal.   I had him stand up and he probably had a positive cough test but it is difficult to say for certain. He had a negative cough test after he had voided for a few moments into the measuring device.   Based upon what he voided and leaked in the scant volume his total bladder capacity while awake was   I would like to give him Diflucan 100 mg daily for 3 days. I like to put him on Lotrisone cream twice a day and he can even use a  eventually petroleum jelly to help protect that area.   I did send the urine for culture   I reviewed the urodynamics again in the filling phase is very impressive without sine wave type overactivity   We had a very lengthy discussion I think he understands things very well. I am concerned about putting a sphincter of history of bladder capacity is less than 150 mL. He would have poor results and could cause hydronephrosis. He is tired of leaking. I went through Botox with full template. He understands the risk of retention. It is higher in men. He knows he had reconstruction of the bladder neck because a large prostate. He understands functional capacity versus true bladder capacity under anesthesia. Were going to go ahead and measures capacity under anesthesia and placed Botox unless the bladder is very small under anesthesia. We will need clearance for his blood thinner. He now is atrial fibrillation since surgery. He understands we have not really quantitate it yet the severity of his overactive bladder and low bladder capacity and urethral insufficiency.   I will use contrast and reassess his bladder with fluoroscopy     ALLERGIES: No Allergies    MEDICATIONS: Allopurinol 100 mg tablet 1 tablet PO BID  Metoprolol Succinate  Myrbetriq 50 mg tablet, extended release 24 hr 1 tablet PO Daily  Eliquis  Tramadol Hcl  Valsartan     GU PSH: Complex cystometrogram, w/ void pressure and urethral pressure profile studies, any technique - 12/19/2021 Complex Uroflow - 12/19/2021 Cysto Bladder Stone <2.5cm - 07/21/2020 Cysto Uretero Lithotripsy - 2014 Cystoscopy Insert Stent - 2014, 2014 Cystoscopy Ureteroscopy - 2014 Emg surf Electrd - 12/19/2021 Inject For cystogram - 12/19/2021 Intrabd voidng Press - 12/19/2021 Laparoscopy; Lymphadenectomy - 07/21/2020 Locm 300-'399Mg'$ /Ml Iodine,1Ml - 05/07/2020 Prostate Needle Biopsy - 2021, 2019 Robotic Radical Prostatectomy - 07/21/2020       PSH Notes:  Cystoscopy With Ureteroscopy With Lithotripsy, Cystoscopy With Insertion Of Ureteral Stent Right, Cystoscopy With Ureteroscopy Right, Cystoscopy With Insertion Of Ureteral Stent Right, Cholecystectomy, Knee Replacement, Primary Repair Of Ruptured Achilles Tendon   NON-GU PSH: Cholecystectomy (open) - 2014 Inguinal hernia repair (laparoscopic), Bilateral - 07/21/2020 Repair Achilles Tendon - 2014 Revise Knee Joint - 2014 Surgical Pathology, Gross And Microscopic Examination For Prostate Needle - 2021, 2019     GU PMH: Incontinence w/o Sensation - 02/22/2022, - 01/20/2022, - 12/09/2021 Mixed incontinence - 02/22/2022, - 01/20/2022, - 12/19/2021, - 12/09/2021 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, - 05/02/2021, - 11/01/2020, - 08/02/2020, - 06/08/2020 Prostate Cancer - 11/07/2021, - 05/02/2021, - 11/01/2020, - 08/02/2020, - 06/23/2020, - 06/08/2020 Renal calculus - 11/07/2021, - 05/02/2021, - 11/01/2020, - 08/02/2020, - 06/08/2020 Stress Incontinence - 11/07/2021, - 05/02/2021, - 12/29/2020, - 11/15/2020, - 11/01/2020, - 10/25/2020, - 10/07/2020, - 08/30/2020, - 08/18/2020, - 08/02/2020 Ureteral calculus - 06/08/2020  PMH Notes:  2013-04-04 13:24:29 - Note: Arthritis   NON-GU PMH: Hyperuricemia - 11/07/2021, - 05/02/2021, - 11/01/2020, - 08/02/2020, - 2021 Muscle weakness (generalized) - 12/29/2020, - 11/15/2020, - 10/25/2020, - 10/07/2020, - 08/30/2020, - 08/18/2020 Other muscle spasm - 12/29/2020, - 11/15/2020, - 10/25/2020, - 10/07/2020, - 08/30/2020, - 08/18/2020    FAMILY HISTORY: adopted - Runs In Family Family Health Status Number - Runs In Family   SOCIAL HISTORY: Marital Status: Married Preferred Language: English; Ethnicity: Not Hispanic Or Latino; Race: White Current Smoking Status: Patient has never smoked.   Tobacco Use Assessment Completed: Used Tobacco in last 30 days? Does not use smokeless tobacco. Social Drinker.  Does not use drugs. Drinks 3 caffeinated drinks per day. Has not had a blood  transfusion.    REVIEW OF SYSTEMS:    GU Review Male:   Patient reports frequent urination, hard to postpone urination, get up at night to urinate, and leakage of urine. Patient denies burning/ pain with urination, stream starts and stops, trouble starting your stream, have to strain to urinate , erection problems, and penile pain.  Gastrointestinal (Upper):   Patient denies nausea, vomiting, and indigestion/ heartburn.  Gastrointestinal (Lower):   Patient denies diarrhea and constipation.  Constitutional:   Patient denies fever, night sweats, weight loss, and fatigue.  Skin:   Patient denies skin rash/ lesion and itching.  Eyes:   Patient denies blurred vision and double vision.  Ears/ Nose/ Throat:   Patient denies sore throat and sinus problems.  Hematologic/Lymphatic:   Patient denies swollen glands and easy bruising.  Cardiovascular:   Patient denies chest pains and leg swelling.  Respiratory:   Patient denies cough and shortness of breath.  Endocrine:   Patient denies excessive thirst.  Musculoskeletal:   Patient denies back pain and joint pain.  Neurological:   Patient denies headaches and dizziness.  Psychologic:   Patient denies depression and anxiety.   VITAL SIGNS: None   Complexity of Data:   10/31/21 04/25/21 10/25/20 02/17/20 08/06/19 02/04/19 08/13/18 12/31/17  PSA  Total PSA <0.015 ng/mL <0.015 ng/mL <0.015 ng/mL 15.70 ng/mL 8.74 ng/mL 8.91 ng/mL 7.51 ng/mL 6.84 ng/mL  Free PSA        1.37 ng/mL  % Free PSA        20 % PSA    PROCEDURES:         Flexible Cystoscopy - 52000  Risks, benefits, and some of the potential complications of the procedure were discussed at length with the patient. All questions were answered. Informed consent was obtained. Sterile technique and intraurethral analgesia were used.  Urethra:  Patient underwent flexible cystoscopy. The penile bulbar urethra normal. Bladder neck open. No foreign body. He appeared to have a very small bladder  capacity and he had sensory urgency quite quickly. Trigone normal.      The lower urinary tract was carefully examined. The procedure was well-tolerated and without complications. Instructions were given to call the office immediately for bloody urine, difficulty urinating, urinary retention, painful or frequent urination, fever, chills, nausea, vomiting or other illness. The patient stated that he understood these instructions and would comply with them.   ASSESSMENT:      ICD-10 Details  1 GU:   Incontinence w/o Sensation - N39.42   2   Mixed incontinence - N39.46      PLAN:            Medications New Meds: Clotrimazole-Betamethasone 1 %-0.05 % cream 1 gram Topical BID Apply  cream twice a day as needed  #30  5 Refill(s)  Fluconazole 100 mg tablet 1 tablet PO Daily   #3  0 Refill(s)  Pharmacy Name:  [id number] Address:  [address] [address] Phone:  [phone] Fax:  [phone]           Orders Labs Urine Culture          Schedule Return Visit/Planned Activity: Return PRN - Office Visit          Document Letter(s):  Created for Patient: Clinical Summary         Next Appointment:      Next Appointment: 05/01/2022 09:30 AM    Appointment Type: Laboratory Appointment    Location: [facility] [phone]   Provider: Lab LAB    Reason for Visit: 6 mo PSA

## 2022-04-28 NOTE — H&P (Signed)
I was consulted to assist the patient for urinary incontinence. He had a robotic prostatectomy 2021. PSA has been normal. 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 Has seen physical therapy. Has tried a penile clamp.   The patient leaks with coughing sneezing and worse with activity. He has urge incontinence. He has high-volume bedwetting. He leaks without awareness. He can soak up to 8 pads a day   He is not voiding very much most of the urine is in the pad. He may void a small amount every few hours and change his pad every 2 or 3 hours. He gets up 2 or 3 times a night. I really could not quantitate the strength of the flow   It appears he had 2 hernia repairs at the time of his surgery in the inguinal area with no mesh. 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 He has a moderate suprapubic fat pad. I did not see an inguinal incisions. He had a right pustule near the right inguinal area that would need to be cleared prior to any surgery. He was dripping in the standing position and had a negative cough test but his bladder may have been empty. Normal scrotal size.   Patient did not void prior to the study and was catheterized for a few milliliters. Maximum bladder capacity was 130 mL. Increased bladder sensation. He did very impressive bladder overactivity reaching pressures of 34 cm of water. He felt in his urgency and burning and leaked a mild to moderate amount with the contractions. There were multiple contractions provoked and unprovoked triggered by coughing and Valsalva. They were very repetitive almost like a sine wave. He had no stress incontinence with a Valsalva pressure of 84 cm of water he would only leak when he did provoke a contraction. During voluntary voiding he voided 113 mL with a max of flow of 5 mils per second. He emptied efficiently. He did not generate a detrusor contraction EMG  activity decreased during voiding. He had an oblong shape bladder and perhaps some funneling of the bladder neck.   I was surprised by the results of the test. When I do a cystoscopy in the future I will also assess him for stress incontinence supine and standing. I will try to help his overactive bladder first. Whether to do an artificial sphincter or third line overactive bladder treatment would need to be discussed. Ongoing symptoms with an artificial sphincter are definite possibility. I will continue to look for objective signs of stress incontinence. Reassess in 5 weeks on Myrbetriq 50 mg samples and prescription in time and schedule voiding   Eventually he will need a cystoscopy   Today  Incontinence the same are a bit worse. Frequency stable.  See for cystoscopy in 5 weeks on Gemtesa. Recheck skin for any skin issues. Proceed accordingly. Clinically not infected  I will try to assess bladder capacity on bladder filling and have him stand and check for stress incontinence. A graduate will also be helpful in trying to assess functional capacity   TOday  Symptoms were worse on the Gemtesa. The pustule in the right groin is gone but now he has urinary dermatitis with likely yeast in both inguinal areas worse on the left than right  Concealed penis.   Patient underwent flexible cystoscopy. The penile bulbar urethra normal. Bladder neck open. No foreign body. He appeared  to have a very small bladder capacity and he had sensory urgency quite quickly. Trigone normal.   I had him stand up and he probably had a positive cough test but it is difficult to say for certain. He had a negative cough test after he had voided for a few moments into the measuring device.   Based upon what he voided and leaked in the scant volume his total bladder capacity while awake was   I would like to give him Diflucan 100 mg daily for 3 days. I like to put him on Lotrisone cream twice a day and he can even use a  eventually petroleum jelly to help protect that area.   I did send the urine for culture   I reviewed the urodynamics again in the filling phase is very impressive without sine wave type overactivity   We had a very lengthy discussion I think he understands things very well. I am concerned about putting a sphincter of history of bladder capacity is less than 150 mL. He would have poor results and could cause hydronephrosis. He is tired of leaking. I went through Botox with full template. He understands the risk of retention. It is higher in men. He knows he had reconstruction of the bladder neck because a large prostate. He understands functional capacity versus true bladder capacity under anesthesia. Were going to go ahead and measures capacity under anesthesia and placed Botox unless the bladder is very small under anesthesia. We will need clearance for his blood thinner. He now is atrial fibrillation since surgery. He understands we have not really quantitate it yet the severity of his overactive bladder and low bladder capacity and urethral insufficiency.   I will use contrast and reassess his bladder with fluoroscopy     ALLERGIES: No Allergies    MEDICATIONS: Allopurinol 100 mg tablet 1 tablet PO BID  Metoprolol Succinate  Myrbetriq 50 mg tablet, extended release 24 hr 1 tablet PO Daily  Eliquis  Tramadol Hcl  Valsartan     GU PSH: Complex cystometrogram, w/ void pressure and urethral pressure profile studies, any technique - 12/19/2021 Complex Uroflow - 12/19/2021 Cysto Bladder Stone <2.5cm - 07/21/2020 Cysto Uretero Lithotripsy - 2014 Cystoscopy Insert Stent - 2014, 2014 Cystoscopy Ureteroscopy - 2014 Emg surf Electrd - 12/19/2021 Inject For cystogram - 12/19/2021 Intrabd voidng Press - 12/19/2021 Laparoscopy; Lymphadenectomy - 07/21/2020 Locm 300-'399Mg'$ /Ml Iodine,1Ml - 05/07/2020 Prostate Needle Biopsy - 2021, 2019 Robotic Radical Prostatectomy - 07/21/2020       PSH Notes:  Cystoscopy With Ureteroscopy With Lithotripsy, Cystoscopy With Insertion Of Ureteral Stent Right, Cystoscopy With Ureteroscopy Right, Cystoscopy With Insertion Of Ureteral Stent Right, Cholecystectomy, Knee Replacement, Primary Repair Of Ruptured Achilles Tendon   NON-GU PSH: Cholecystectomy (open) - 2014 Inguinal hernia repair (laparoscopic), Bilateral - 07/21/2020 Repair Achilles Tendon - 2014 Revise Knee Joint - 2014 Surgical Pathology, Gross And Microscopic Examination For Prostate Needle - 2021, 2019     GU PMH: Incontinence w/o Sensation - 02/22/2022, - 01/20/2022, - 12/09/2021 Mixed incontinence - 02/22/2022, - 01/20/2022, - 12/19/2021, - 12/09/2021 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, - 05/02/2021, - 11/01/2020, - 08/02/2020, - 06/08/2020 Prostate Cancer - 11/07/2021, - 05/02/2021, - 11/01/2020, - 08/02/2020, - 06/23/2020, - 06/08/2020 Renal calculus - 11/07/2021, - 05/02/2021, - 11/01/2020, - 08/02/2020, - 06/08/2020 Stress Incontinence - 11/07/2021, - 05/02/2021, - 12/29/2020, - 11/15/2020, - 11/01/2020, - 10/25/2020, - 10/07/2020, - 08/30/2020, - 08/18/2020, - 08/02/2020 Ureteral calculus - 06/08/2020  PMH Notes:  2013-04-04 13:24:29 - Note: Arthritis   NON-GU PMH: Hyperuricemia - 11/07/2021, - 05/02/2021, - 11/01/2020, - 08/02/2020, - 2021 Muscle weakness (generalized) - 12/29/2020, - 11/15/2020, - 10/25/2020, - 10/07/2020, - 08/30/2020, - 08/18/2020 Other muscle spasm - 12/29/2020, - 11/15/2020, - 10/25/2020, - 10/07/2020, - 08/30/2020, - 08/18/2020    FAMILY HISTORY: adopted - Runs In Family Family Health Status Number - Runs In Family   SOCIAL HISTORY: Marital Status: Married Preferred Language: English; Ethnicity: Not Hispanic Or Latino; Race: White Current Smoking Status: Patient has never smoked.   Tobacco Use Assessment Completed: Used Tobacco in last 30 days? Does not use smokeless tobacco. Social Drinker.  Does not use drugs. Drinks 3 caffeinated drinks per day. Has not had a blood  transfusion.    REVIEW OF SYSTEMS:    GU Review Male:   Patient reports frequent urination, hard to postpone urination, get up at night to urinate, and leakage of urine. Patient denies burning/ pain with urination, stream starts and stops, trouble starting your stream, have to strain to urinate , erection problems, and penile pain.  Gastrointestinal (Upper):   Patient denies nausea, vomiting, and indigestion/ heartburn.  Gastrointestinal (Lower):   Patient denies diarrhea and constipation.  Constitutional:   Patient denies fever, night sweats, weight loss, and fatigue.  Skin:   Patient denies skin rash/ lesion and itching.  Eyes:   Patient denies blurred vision and double vision.  Ears/ Nose/ Throat:   Patient denies sore throat and sinus problems.  Hematologic/Lymphatic:   Patient denies swollen glands and easy bruising.  Cardiovascular:   Patient denies chest pains and leg swelling.  Respiratory:   Patient denies cough and shortness of breath.  Endocrine:   Patient denies excessive thirst.  Musculoskeletal:   Patient denies back pain and joint pain.  Neurological:   Patient denies headaches and dizziness.  Psychologic:   Patient denies depression and anxiety.   VITAL SIGNS: None   Complexity of Data:   10/31/21 04/25/21 10/25/20 02/17/20 08/06/19 02/04/19 08/13/18 12/31/17  PSA  Total PSA <0.015 ng/mL <0.015 ng/mL <0.015 ng/mL 15.70 ng/mL 8.74 ng/mL 8.91 ng/mL 7.51 ng/mL 6.84 ng/mL  Free PSA        1.37 ng/mL  % Free PSA        20 % PSA    PROCEDURES:         Flexible Cystoscopy - 52000  Risks, benefits, and some of the potential complications of the procedure were discussed at length with the patient. All questions were answered. Informed consent was obtained. Sterile technique and intraurethral analgesia were used.  Urethra:  Patient underwent flexible cystoscopy. The penile bulbar urethra normal. Bladder neck open. No foreign body. He appeared to have a very small bladder  capacity and he had sensory urgency quite quickly. Trigone normal.      The lower urinary tract was carefully examined. The procedure was well-tolerated and without complications. Instructions were given to call the office immediately for bloody urine, difficulty urinating, urinary retention, painful or frequent urination, fever, chills, nausea, vomiting or other illness. The patient stated that he understood these instructions and would comply with them.   ASSESSMENT:      ICD-10 Details  1 GU:   Incontinence w/o Sensation - N39.42   2   Mixed incontinence - N39.46      PLAN:            Medications New Meds: Clotrimazole-Betamethasone 1 %-0.05 % cream 1 gram Topical BID Apply  cream twice a day as needed  #30  5 Refill(s)  Fluconazole 100 mg tablet 1 tablet PO Daily   #3  0 Refill(s)  Pharmacy Name:  [id number] Address:  [address] [address] Phone:  [phone] Fax:  [phone]           Orders Labs Urine Culture          Schedule Return Visit/Planned Activity: Return PRN - Office Visit          Document Letter(s):  Created for Patient: Clinical Summary         Next Appointment:      Next Appointment: 05/01/2022 09:30 AM    Appointment Type: Laboratory Appointment    Location: [facility] [phone]   Provider: Lab LAB    Reason for Visit: 6 mo PSA

## 2022-05-02 ENCOUNTER — Encounter (HOSPITAL_BASED_OUTPATIENT_CLINIC_OR_DEPARTMENT_OTHER): Payer: Self-pay | Admitting: Anesthesiology

## 2022-05-02 ENCOUNTER — Ambulatory Visit (HOSPITAL_BASED_OUTPATIENT_CLINIC_OR_DEPARTMENT_OTHER)
Admission: RE | Admit: 2022-05-02 | Discharge: 2022-05-02 | Disposition: A | Discharge status: Home / Self Care | Payer: Medicare PPO | Source: Ambulatory Visit | Attending: Urology | Admitting: Urology

## 2022-05-02 ENCOUNTER — Other Ambulatory Visit: Payer: Self-pay

## 2022-05-02 ENCOUNTER — Encounter (HOSPITAL_BASED_OUTPATIENT_CLINIC_OR_DEPARTMENT_OTHER): Payer: Self-pay | Admitting: Urology

## 2022-05-02 ENCOUNTER — Encounter (HOSPITAL_BASED_OUTPATIENT_CLINIC_OR_DEPARTMENT_OTHER)
Admission: RE | Disposition: A | Discharge status: Home / Self Care | Payer: Self-pay | Source: Ambulatory Visit | Attending: Urology

## 2022-05-02 DIAGNOSIS — Z539 Procedure and treatment not carried out, unspecified reason: Secondary | ICD-10-CM | POA: Diagnosis not present

## 2022-05-02 DIAGNOSIS — Z01818 Encounter for other preprocedural examination: Secondary | ICD-10-CM

## 2022-05-02 DIAGNOSIS — N3281 Overactive bladder: Secondary | ICD-10-CM | POA: Diagnosis not present

## 2022-05-02 DIAGNOSIS — R32 Unspecified urinary incontinence: Secondary | ICD-10-CM | POA: Insufficient documentation

## 2022-05-02 DIAGNOSIS — N4883 Acquired buried penis: Secondary | ICD-10-CM | POA: Insufficient documentation

## 2022-05-02 HISTORY — DX: Presence of spectacles and contact lenses: Z97.3

## 2022-05-02 LAB — POCT I-STAT, CHEM 8
BUN: 24 mg/dL — ABNORMAL HIGH (ref 8–23)
Calcium, Ion: 1.13 mmol/L — ABNORMAL LOW (ref 1.15–1.40)
Chloride: 106 mmol/L (ref 98–111)
Creatinine, Ser: 1.2 mg/dL (ref 0.61–1.24)
Glucose, Bld: 106 mg/dL — ABNORMAL HIGH (ref 70–99)
HCT: 48 % (ref 39.0–52.0)
Hemoglobin: 16.3 g/dL (ref 13.0–17.0)
Potassium: 4.3 mmol/L (ref 3.5–5.1)
Sodium: 144 mmol/L (ref 135–145)
TCO2: 26 mmol/L (ref 22–32)

## 2022-05-02 SURGERY — BOTOX INJECTION
Anesthesia: General

## 2022-05-02 MED ORDER — CIPROFLOXACIN IN D5W 400 MG/200ML IV SOLN: 400.0000 mg | Freq: Two times a day (BID) | INTRAVENOUS | Status: DC

## 2022-05-02 MED ORDER — ACETAMINOPHEN 500 MG PO TABS
ORAL_TABLET | ORAL | Status: AC
  Filled 2022-05-02: qty 2

## 2022-05-02 MED ORDER — LACTATED RINGERS IV SOLN: INTRAVENOUS | Status: DC

## 2022-05-02 MED ORDER — CIPROFLOXACIN IN D5W 400 MG/200ML IV SOLN
INTRAVENOUS | Status: AC
  Filled 2022-05-02: qty 200

## 2022-05-02 MED ORDER — ACETAMINOPHEN 500 MG PO TABS
1000.0000 mg | ORAL_TABLET | Freq: Once | ORAL | Status: AC
Start: 2022-05-02 — End: 2022-05-02
  Administered 2022-05-02: 1000 mg via ORAL

## 2022-05-02 NOTE — Progress Notes (Signed)
BP too high, surgery canceled by anesthesia. Surgeon made aware.

## 2022-05-02 NOTE — Anesthesia Preprocedure Evaluation (Addendum)
Anesthesia Evaluation  Patient identified by MRN, date of birth, ID band Patient awake    Reviewed: Allergy & Precautions, NPO status , Patient's Chart, lab work & pertinent test results, reviewed documented beta blocker date and time   Airway Mallampati: III  TM Distance: >3 FB Neck ROM: Full    Dental no notable dental hx.    Pulmonary neg pulmonary ROS,    Pulmonary exam normal breath sounds clear to auscultation       Cardiovascular hypertension (190/81 in preop taken by nursing, repeat BPs taken personally were all >220/120), Pt. on medications and Pt. on home beta blockers Normal cardiovascular exam+ dysrhythmias (eliquis) Atrial Fibrillation  Rhythm:Regular Rate:Normal     Neuro/Psych negative neurological ROS  negative psych ROS   GI/Hepatic negative GI ROS, Neg liver ROS,   Endo/Other  BMI 36  Renal/GU negative Renal ROS Bladder dysfunction      Musculoskeletal  (+) Arthritis , Osteoarthritis,    Abdominal (+) + obese,   Peds negative pediatric ROS (+)  Hematology negative hematology ROS (+)   Anesthesia Other Findings   Reproductive/Obstetrics negative OB ROS                            Anesthesia Physical Anesthesia Plan  ASA: 3  Anesthesia Plan: General   Post-op Pain Management: Tylenol PO (pre-op)*   Induction: Intravenous  PONV Risk Score and Plan: 2 and Ondansetron, Dexamethasone and Treatment may vary due to age or medical condition  Airway Management Planned: LMA  Additional Equipment: None  Intra-op Plan:   Post-operative Plan:   Informed Consent:   Plan Discussed with:   Anesthesia Plan Comments: (Repeat blood pressure measurements on both upper extremities taken multiple times with multiple machines- consistently >220/120. Per pt, has not taken valsartan for 2 days but did take his metoprolol this AM. I discussed with him that his risk of heart attack  and stroke are both very high with these blood pressures and that his risk of perioperative adverse events is too high to safely anesthetize him today for an elective procedure. I advised him to take BP medicine as soon as he returns home and to monitor his BP at home or the closest pharmacy.  Upon rescheduling, I advised him to take all of his BP medicines as scheduled preop- do not hold valsartan. )       Anesthesia Quick Evaluation

## 2022-05-09 ENCOUNTER — Other Ambulatory Visit: Payer: Self-pay | Admitting: Urology

## 2022-05-10 ENCOUNTER — Encounter (HOSPITAL_BASED_OUTPATIENT_CLINIC_OR_DEPARTMENT_OTHER): Payer: Self-pay | Admitting: Urology

## 2022-05-10 ENCOUNTER — Other Ambulatory Visit: Payer: Self-pay

## 2022-05-10 ENCOUNTER — Telehealth: Payer: Self-pay | Admitting: Cardiovascular Disease

## 2022-05-10 NOTE — Telephone Encounter (Signed)
Called patient back about his message. Patient stated he was scheduled for surgery on Monday and they had him hold his valsartan, which made his BP reading 170/80. Patient stated they canceled his surgery due his BP being high. Patient started back on his valsartan now SBP's between 140's and 150's,  HR 52. Patient stated they have rescheduled his surgery for  05/23/22. Patient wants something to get his BP lowered, so they will not cancel his surgery again. Patient stated his BP was probably up due to anxiety and having the loss of his son so recent. Patient would like to have something he can take to get his BP down, like PRN. Will send message to Dr. Johnsie Cancel for advisement.

## 2022-05-10 NOTE — Progress Notes (Addendum)
Spoke w/ via phone for pre-op interview---pt Lab needs dos----  I stat             Lab results----- COVID test -----patient states asymptomatic no test needed Arrive at -------530 am 05-23-2022 NPO after MN NO Solid Food.  Clear liquids from MN until---430 am Med rec completed Medications to take morning of surgery -----metoprolol succinate, allopurinol, valsartan  Diabetic medication -----n/a Patient instructed no nail polish to be worn day of surgery Patient instructed to bring photo id and insurance card day of surgery Patient aware to have Driver (ride ) / caregiver    wife Savreen Gebhardt for 24 hours after surgery  Patient Special Instructions -----none Pre-Op special Istructions -----cardiac clearance note/note to hold  eliquis 2 days before surgery emily monge np dated 03-29-2022 chart/epic (pt aware last dose eliquis is to be 05-20-2022) Patient verbalized understanding of instructions that were given at this phone interview. Patient denies shortness of breath, chest pain, fever, cough at this phone  interview.     Reviewed patient history with dr Silvio Clayman mda (patient cancelled 05-02-2022 for elevated blood pressure, pt has blood pressure cuff at home and is monitoring blood pressure, pt states blood pressure averages 150/80 at home), patient instructed to take valsartan day of surgery per dr e finucaine mda.  Marthann Schiller 03-08-2022 chart/epic [facility]  cardiology dr Johnsie Cancel 03-08-2022 Echo 07-16-2020 epic

## 2022-05-10 NOTE — Telephone Encounter (Signed)
Pt c/o BP issue: STAT if pt c/o blurred vision, one-sided weakness or slurred speech  1. What are your last 5 BP readings? 170/80  2. Are you having any other symptoms (ex. Dizziness, headache, blurred vision, passed out)?    3. What is your BP issue? Patient is worry bout his bp being hight. When he went to the dr the other day he got his reading. Stated a procedure got cancel because of his bp. Please advise

## 2022-05-11 MED ORDER — HYDRALAZINE HCL 50 MG PO TABS: ORAL_TABLET | ORAL | 0 refills | Status: DC

## 2022-05-11 NOTE — Telephone Encounter (Signed)
Per Dr. Johnsie Cancel, Take his lopressor and valsartan day before and morning of his procedure. Can also call in hydralazine 50 mg to take night before and morning of, and hold eliquis for 2 days. Called patient with advisement. Patient verbalized understanding. Will send hydralazine to patient's pharmacy of choice.

## 2022-05-22 NOTE — Anesthesia Preprocedure Evaluation (Signed)
Anesthesia Evaluation  Patient identified by MRN, date of birth, ID band Patient awake    Reviewed: Allergy & Precautions, NPO status , Patient's Chart, lab work & pertinent test results  History of Anesthesia Complications Negative for: history of anesthetic complications  Airway Mallampati: III  TM Distance: >3 FB Neck ROM: Full    Dental no notable dental hx. (+) Dental Advisory Given   Pulmonary neg pulmonary ROS,    Pulmonary exam normal        Cardiovascular hypertension, Pt. on medications and Pt. on home beta blockers + dysrhythmias (eliquis) Atrial Fibrillation  Rhythm:Irregular Rate:Normal     Neuro/Psych negative neurological ROS  negative psych ROS   GI/Hepatic negative GI ROS, Neg liver ROS,   Endo/Other  BMI 36  Renal/GU negative Renal ROS Bladder dysfunction      Musculoskeletal  (+) Arthritis , Osteoarthritis,    Abdominal   Peds negative pediatric ROS (+)  Hematology negative hematology ROS (+)   Anesthesia Other Findings   Reproductive/Obstetrics negative OB ROS                            Anesthesia Physical  Anesthesia Plan  ASA: 3  Anesthesia Plan: General   Post-op Pain Management: Tylenol PO (pre-op)*   Induction: Intravenous  PONV Risk Score and Plan: 2 and Ondansetron, Dexamethasone and Treatment may vary due to age or medical condition  Airway Management Planned: LMA  Additional Equipment: None  Intra-op Plan:   Post-operative Plan: Extubation in OR  Informed Consent: I have reviewed the patients History and Physical, chart, labs and discussed the procedure including the risks, benefits and alternatives for the proposed anesthesia with the patient or authorized representative who has indicated his/her understanding and acceptance.     Dental advisory given  Plan Discussed with: Anesthesiologist and CRNA  Anesthesia Plan Comments:         Anesthesia Quick Evaluation

## 2022-05-23 ENCOUNTER — Ambulatory Visit (HOSPITAL_BASED_OUTPATIENT_CLINIC_OR_DEPARTMENT_OTHER): Payer: Medicare PPO | Admitting: Anesthesiology

## 2022-05-23 ENCOUNTER — Ambulatory Visit (HOSPITAL_BASED_OUTPATIENT_CLINIC_OR_DEPARTMENT_OTHER)
Admission: RE | Admit: 2022-05-23 | Discharge: 2022-05-23 | Disposition: A | Discharge status: Home / Self Care | Payer: Medicare PPO | Attending: Urology | Admitting: Urology

## 2022-05-23 ENCOUNTER — Encounter (HOSPITAL_BASED_OUTPATIENT_CLINIC_OR_DEPARTMENT_OTHER)
Admission: RE | Disposition: A | Discharge status: Home / Self Care | Payer: Self-pay | Source: Home / Self Care | Attending: Urology

## 2022-05-23 ENCOUNTER — Encounter (HOSPITAL_BASED_OUTPATIENT_CLINIC_OR_DEPARTMENT_OTHER): Payer: Self-pay | Admitting: Urology

## 2022-05-23 DIAGNOSIS — I10 Essential (primary) hypertension: Secondary | ICD-10-CM

## 2022-05-23 DIAGNOSIS — N3946 Mixed incontinence: Secondary | ICD-10-CM | POA: Diagnosis present

## 2022-05-23 DIAGNOSIS — Z7901 Long term (current) use of anticoagulants: Secondary | ICD-10-CM | POA: Insufficient documentation

## 2022-05-23 DIAGNOSIS — I4891 Unspecified atrial fibrillation: Secondary | ICD-10-CM | POA: Diagnosis not present

## 2022-05-23 DIAGNOSIS — N3289 Other specified disorders of bladder: Secondary | ICD-10-CM | POA: Diagnosis not present

## 2022-05-23 DIAGNOSIS — M1990 Unspecified osteoarthritis, unspecified site: Secondary | ICD-10-CM

## 2022-05-23 DIAGNOSIS — Z9079 Acquired absence of other genital organ(s): Secondary | ICD-10-CM | POA: Insufficient documentation

## 2022-05-23 DIAGNOSIS — Z01818 Encounter for other preprocedural examination: Secondary | ICD-10-CM

## 2022-05-23 HISTORY — PX: CYSTOGRAM: SHX6285

## 2022-05-23 HISTORY — PX: BOTOX INJECTION: SHX5754

## 2022-05-23 LAB — POCT I-STAT, CHEM 8
BUN: 18 mg/dL (ref 8–23)
Calcium, Ion: 1.27 mmol/L (ref 1.15–1.40)
Chloride: 104 mmol/L (ref 98–111)
Creatinine, Ser: 1.1 mg/dL (ref 0.61–1.24)
Glucose, Bld: 106 mg/dL — ABNORMAL HIGH (ref 70–99)
HCT: 49 % (ref 39.0–52.0)
Hemoglobin: 16.7 g/dL (ref 13.0–17.0)
Potassium: 3.9 mmol/L (ref 3.5–5.1)
Sodium: 144 mmol/L (ref 135–145)
TCO2: 26 mmol/L (ref 22–32)

## 2022-05-23 SURGERY — BOTOX INJECTION
Anesthesia: General | Site: Bladder

## 2022-05-23 MED ORDER — CIPROFLOXACIN IN D5W 400 MG/200ML IV SOLN
INTRAVENOUS | Status: AC
  Filled 2022-05-23: qty 200

## 2022-05-23 MED ORDER — 0.9 % SODIUM CHLORIDE (POUR BTL) OPTIME
TOPICAL | Status: DC | PRN
  Administered 2022-05-23: 500 mL

## 2022-05-23 MED ORDER — MIDAZOLAM HCL 2 MG/2ML IJ SOLN
INTRAMUSCULAR | Status: DC | PRN
  Administered 2022-05-23: 1 mg via INTRAVENOUS

## 2022-05-23 MED ORDER — FENTANYL CITRATE (PF) 100 MCG/2ML IJ SOLN: 25.0000 ug | INTRAMUSCULAR | Status: DC | PRN

## 2022-05-23 MED ORDER — MIDAZOLAM HCL 2 MG/2ML IJ SOLN
INTRAMUSCULAR | Status: AC
  Filled 2022-05-23: qty 2

## 2022-05-23 MED ORDER — PROMETHAZINE HCL 25 MG/ML IJ SOLN: 6.2500 mg | INTRAMUSCULAR | Status: DC | PRN

## 2022-05-23 MED ORDER — LACTATED RINGERS IV SOLN: INTRAVENOUS | Status: DC

## 2022-05-23 MED ORDER — PROPOFOL 10 MG/ML IV BOLUS
INTRAVENOUS | Status: DC | PRN
  Administered 2022-05-23: 150 mg via INTRAVENOUS

## 2022-05-23 MED ORDER — AMISULPRIDE (ANTIEMETIC) 5 MG/2ML IV SOLN: 10.0000 mg | Freq: Once | INTRAVENOUS | Status: DC | PRN

## 2022-05-23 MED ORDER — PROPOFOL 10 MG/ML IV BOLUS
INTRAVENOUS | Status: AC
  Filled 2022-05-23: qty 20

## 2022-05-23 MED ORDER — ONABOTULINUMTOXINA 100 UNITS IJ SOLR
INTRAMUSCULAR | Status: DC | PRN
  Administered 2022-05-23: 100 [IU] via INTRAMUSCULAR

## 2022-05-23 MED ORDER — ACETAMINOPHEN 500 MG PO TABS
1000.0000 mg | ORAL_TABLET | Freq: Once | ORAL | Status: AC
  Administered 2022-05-23: 1000 mg via ORAL

## 2022-05-23 MED ORDER — FENTANYL CITRATE (PF) 100 MCG/2ML IJ SOLN
INTRAMUSCULAR | Status: AC
  Filled 2022-05-23: qty 2

## 2022-05-23 MED ORDER — ACETAMINOPHEN 500 MG PO TABS
ORAL_TABLET | ORAL | Status: AC
  Filled 2022-05-23: qty 2

## 2022-05-23 MED ORDER — STERILE WATER FOR IRRIGATION IR SOLN
Status: DC | PRN
  Administered 2022-05-23: 3000 mL

## 2022-05-23 MED ORDER — SODIUM CHLORIDE 0.9 % IV SOLN: INTRAVENOUS | Status: DC | PRN

## 2022-05-23 MED ORDER — PHENYLEPHRINE 80 MCG/ML (10ML) SYRINGE FOR IV PUSH (FOR BLOOD PRESSURE SUPPORT)
PREFILLED_SYRINGE | INTRAVENOUS | Status: DC | PRN
  Administered 2022-05-23 (×2): 80 ug via INTRAVENOUS

## 2022-05-23 MED ORDER — FENTANYL CITRATE (PF) 250 MCG/5ML IJ SOLN
INTRAMUSCULAR | Status: DC | PRN
  Administered 2022-05-23 (×2): 25 ug via INTRAVENOUS

## 2022-05-23 MED ORDER — CELECOXIB 200 MG PO CAPS
200.0000 mg | ORAL_CAPSULE | Freq: Once | ORAL | Status: AC
  Administered 2022-05-23: 200 mg via ORAL

## 2022-05-23 MED ORDER — LIDOCAINE 2% (20 MG/ML) 5 ML SYRINGE
INTRAMUSCULAR | Status: DC | PRN
  Administered 2022-05-23: 100 mg via INTRAVENOUS

## 2022-05-23 MED ORDER — ONDANSETRON HCL 4 MG/2ML IJ SOLN
INTRAMUSCULAR | Status: DC | PRN
  Administered 2022-05-23: 4 mg via INTRAVENOUS

## 2022-05-23 MED ORDER — CIPROFLOXACIN IN D5W 400 MG/200ML IV SOLN
400.0000 mg | INTRAVENOUS | Status: AC
  Administered 2022-05-23: 400 mg via INTRAVENOUS

## 2022-05-23 MED ORDER — DEXAMETHASONE SODIUM PHOSPHATE 10 MG/ML IJ SOLN
INTRAMUSCULAR | Status: DC | PRN
  Administered 2022-05-23: 5 mg via INTRAVENOUS

## 2022-05-23 MED ORDER — SODIUM CHLORIDE (PF) 0.9 % IJ SOLN
INTRAMUSCULAR | Status: DC | PRN
  Administered 2022-05-23: 10 mL

## 2022-05-23 MED ORDER — CELECOXIB 200 MG PO CAPS
ORAL_CAPSULE | ORAL | Status: AC
  Filled 2022-05-23: qty 1

## 2022-05-23 SURGICAL SUPPLY — 17 items
BAG DRAIN URO-CYSTO SKYTR STRL (DRAIN) ×1 IMPLANT
BAG DRN UROCATH (DRAIN) ×1
CATH FOLEY 2WAY SLVR  5CC 16FR (CATHETERS) ×1
CATH FOLEY 2WAY SLVR 5CC 16FR (CATHETERS) IMPLANT
CATH ROBINSON RED A/P 16FR (CATHETERS) IMPLANT
CLOTH BEACON ORANGE TIMEOUT ST (SAFETY) ×1 IMPLANT
GLOVE BIO SURGEON STRL SZ7.5 (GLOVE) ×1 IMPLANT
GLOVE BIOGEL PI IND STRL 7.0 (GLOVE) IMPLANT
GOWN STRL REUS W/TWL XL LVL3 (GOWN DISPOSABLE) ×1 IMPLANT
KIT TURNOVER CYSTO (KITS) ×1 IMPLANT
MANIFOLD NEPTUNE II (INSTRUMENTS) ×1 IMPLANT
NDL ASPIRATION 22 (NEEDLE) IMPLANT
NEEDLE ASPIRATION 22 (NEEDLE) ×1 IMPLANT
PACK CYSTO (CUSTOM PROCEDURE TRAY) ×1 IMPLANT
SYR 20ML LL LF (SYRINGE) IMPLANT
TUBE CONNECTING 12X1/4 (SUCTIONS) IMPLANT
WATER STERILE IRR 3000ML UROMA (IV SOLUTION) ×1 IMPLANT

## 2022-05-23 NOTE — Anesthesia Postprocedure Evaluation (Signed)
Anesthesia Post Note  Patient: [name] [name]  Procedure(s) Performed: CYSTOSCOPY BOTOX INJECTION (Bladder) CYSTOGRAM (Bladder)     Patient location during evaluation: PACU Anesthesia Type: General Level of consciousness: sedated Pain management: pain level controlled Vital Signs Assessment: post-procedure vital signs reviewed and stable Respiratory status: spontaneous breathing and respiratory function stable Cardiovascular status: stable Postop Assessment: no apparent nausea or vomiting Anesthetic complications: no   No notable events documented.  Last Vitals:  Vitals:   05/23/22 0815 05/23/22 0830  BP: 137/85 (!) 137/102  Pulse: (!) 53 (!) 47  Resp: (!) 22 16  Temp:    SpO2: 91% 93%    Last Pain:  Vitals:   05/23/22 0815  TempSrc:   PainSc: 3                  Troyce Gieske DANIEL

## 2022-05-23 NOTE — Transfer of Care (Signed)
Immediate Anesthesia Transfer of Care Note  Patient: [name] [name]  Procedure(s) Performed: CYSTOSCOPY BOTOX INJECTION (Bladder) CYSTOGRAM (Bladder)  Patient Location: PACU  Anesthesia Type:General  Level of Consciousness: awake, alert  and oriented  Airway & Oxygen Therapy: Patient Spontanous Breathing  Post-op Assessment: Report given to RN and Post -op Vital signs reviewed and stable  Post vital signs: Reviewed and stable  Last Vitals:  Vitals Value Taken Time  BP 149/98 05/23/22 0811  Temp    Pulse 53 05/23/22 0813  Resp 18 05/23/22 0813  SpO2 95 % 05/23/22 0813  Vitals shown include unvalidated device data.  Last Pain:  Vitals:   05/23/22 0609  TempSrc: Oral  PainSc: 0-No pain      Patients Stated Pain Goal: 5 (16/10/96 0454)  Complications: No notable events documented.

## 2022-05-23 NOTE — Op Note (Addendum)
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 Nicki Reaper Sadee Osland  The patient has the above diagnosis and consented the above procedure.  He has had reconstruction of his bladder neck and I felt he likely had a very small bladder.  Preoperative antibiotics were given  I did not see any rash in the groin or on the scrotum or perineal area.  Visibly the patient's meatus looked reasonably normal but the 23 French cystoscope could not be easily introduced.  I used lots of lubricant and an obturator.  I gently dilated distally with well-lubricated sounds from 20-26 [place].  The scope could easily be introduced.  The penile bulbar urethra normal.  He has a long narrow urethra near the bladder neck likely from his reconstruction.  It was approximately 49 [place] in size.  Trigone was normal.  He appeared to have a small capacity elongated bladder at the dome.  I emptied the bladder and inserted a 16 French catheter easily.  I did a gravity cystogram.  He had an elongated bladder and I took an x-ray.  Capacity with gravity cystogram was approximately 240 mL.  Bladder was emptied.  I then used the [facility] injection scope.  I injected 100 units of Botox in 10 cc of normal saline in the lower third of the bladder.  I did not inject the trigone.  I injected 1 cc with 10 units and 10 injections.  There was no bleeding  At the end of the case he had a little bit of a double stream at the meatus.  There may have been a small tear of the meatus at 6:00 difficult to see but more visible with the split stream.  When I would open up the meatus it looked normal.  It was not bleeding.  I did not feel that a suture would help.  Visibly the meatus looked within normal  limits.  Patient will be taken to recovery room.  He has a borderline bladder capacity for an artificial sphincter.

## 2022-05-23 NOTE — Discharge Instructions (Addendum)
I have reviewed discharge instructions in detail with the patient. They will follow-up with me or their physician as scheduled. My nurse will also be calling the patients as per protocol.    CYSTOSCOPY HOME CARE INSTRUCTIONS  Activity: Rest for the remainder of the day.  Do not drive or operate equipment today.  You may resume normal activities in one to two days as instructed by your physician.   Meals: Drink plenty of liquids and eat light foods such as gelatin or soup this evening.  You may return to a normal meal plan tomorrow.  Return to Work: You may return to work in one to two days or as instructed by your physician.  Special Instructions / Symptoms: Call your physician if any of these symptoms occur:   -persistent or heavy bleeding  -bleeding which continues after first few urination  -large blood clots that are difficult to pass  -urine stream diminishes or stops completely  -fever equal to or higher than 101 degrees Farenheit.  -cloudy urine with a strong, foul odor  -severe pain   You may feel some burning pain when you urinate.  This should disappear with time.  Applying moist heat to the lower abdomen or a hot tub bath may help relieve the pain. \  No acetaminophen/Tylenol until after 12:45 pm today if needed.   No ibuprofen, Advil, Aleve, Motrin, ketorolac, meloxicam, naproxen, or other NSAIDS until after 12:45 pm today if needed.    Post Anesthesia Home Care Instructions  Activity: Get plenty of rest for the remainder of the day. A responsible individual must stay with you for 24 hours following the procedure.  For the next 24 hours, DO NOT: -Drive a car -Paediatric nurse -Drink alcoholic beverages -Take any medication unless instructed by your physician -Make any legal decisions or sign important papers.  Meals: Start with liquid foods such as gelatin or soup. Progress to regular foods as tolerated. Avoid greasy, spicy, heavy foods. If nausea and/or  vomiting occur, drink only clear liquids until the nausea and/or vomiting subsides. Call your physician if vomiting continues.  Special Instructions/Symptoms: Your throat may feel dry or sore from the anesthesia or the breathing tube placed in your throat during surgery. If this causes discomfort, gargle with warm salt water. The discomfort should disappear within 24 hours.

## 2022-05-23 NOTE — Interval H&P Note (Signed)
History and Physical Interval Note:  05/23/2022 7:17 AM  [name] [name]  has presented today for surgery, with the diagnosis of REFRACTORY URGENCY INCONTINENCE.  The various methods of treatment have been discussed with the patient and family. After consideration of risks, benefits and other options for treatment, the patient has consented to  Procedure(s) with comments: CYSTOSCOPY BOTOX INJECTION (N/A) - 30 MINS FOR CASE as a surgical intervention.  The patient's history has been reviewed, patient examined, no change in status, stable for surgery.  I have reviewed the patient's chart and labs.  Questions were answered to the patient's satisfaction.     Marlen Koman A Jenniffer Vessels

## 2022-05-23 NOTE — Anesthesia Procedure Notes (Signed)
Procedure Name: LMA Insertion Date/Time: 05/23/2022 7:32 AM  Performed by: Clearnce Sorrel, CRNAPre-anesthesia Checklist: Patient identified, Emergency Drugs available, Suction available and Patient being monitored Patient Re-evaluated:Patient Re-evaluated prior to induction Oxygen Delivery Method: Circle System Utilized Preoxygenation: Pre-oxygenation with 100% oxygen Induction Type: IV induction Ventilation: Mask ventilation without difficulty LMA: LMA inserted LMA Size: 5.0 Number of attempts: 1 Airway Equipment and Method: Bite block Placement Confirmation: positive ETCO2 Tube secured with: Tape Dental Injury: Teeth and Oropharynx as per pre-operative assessment

## 2022-05-24 ENCOUNTER — Encounter (HOSPITAL_BASED_OUTPATIENT_CLINIC_OR_DEPARTMENT_OTHER): Payer: Self-pay | Admitting: Urology
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
## 2022-08-23 ENCOUNTER — Ambulatory Visit: Payer: Medicare PPO | Attending: Cardiovascular Disease | Admitting: Cardiovascular Disease

## 2022-08-23 ENCOUNTER — Encounter: Payer: Self-pay | Admitting: Cardiovascular Disease

## 2022-08-23 VITALS — BP 158/86 | HR 70 | Ht 76.0 in | Wt 294.8 lb

## 2022-08-23 DIAGNOSIS — I10 Essential (primary) hypertension: Secondary | ICD-10-CM

## 2022-08-23 DIAGNOSIS — I4819 Other persistent atrial fibrillation: Secondary | ICD-10-CM

## 2022-08-23 MED ORDER — VALSARTAN-HYDROCHLOROTHIAZIDE 160-12.5 MG PO TABS: 1.0000 | ORAL_TABLET | Freq: Every day | ORAL | 3 refills | Status: DC

## 2022-08-23 NOTE — Patient Instructions (Addendum)
Medication Instructions:  Your physician has recommended you make the following change in your medication:   1-STOP Valsartan 2-START Diovan/HCTZ 160/12.5 mg by mouth daily.  *If you need a refill on your cardiac medications before your next appointment, please call your pharmacy*  Lab Work: Your physician recommends that you return for lab work in: 4 weeks for BMET  If you have labs (blood work) drawn today and your tests are completely normal, you will receive your results only by: Dane (if you have MyChart) OR A paper copy in the mail If you have any lab test that is abnormal or we need to change your treatment, we will call you to review the results.  Testing/Procedures: None ordered today.  Follow-Up: At [facility], you and your health needs are our priority.  As part of our continuing mission to provide you with exceptional heart care, we have created designated Provider Care Teams.  These Care Teams include your primary Cardiologist (physician) and Advanced Practice Providers (APPs -  Physician Assistants and Nurse Practitioners) who all work together to provide you with the care you need, when you need it.  We recommend signing up for the patient portal called "MyChart".  Sign up information is provided on this After Visit Summary.  MyChart is used to connect with patients for Virtual Visits (Telemedicine).  Patients are able to view lab/test results, encounter notes, upcoming appointments, etc.  Non-urgent messages can be sent to your provider as well.   To learn more about what you can do with MyChart, go to [email or web address].    Your next appointment:   6 weeks  The format for your next appointment:   In Person  Provider:   Jenkins Rouge, MD     Other Instructions You have been referred to Blood Pressure Clinic   Important Information About Sugar

## 2022-09-20 ENCOUNTER — Ambulatory Visit: Payer: Medicare PPO | Attending: Cardiovascular Disease

## 2022-09-20 DIAGNOSIS — I10 Essential (primary) hypertension: Secondary | ICD-10-CM

## 2022-09-20 LAB — BASIC METABOLIC PANEL
BUN/Creatinine Ratio: 14 (ref 10–24)
BUN: 17 mg/dL (ref 8–27)
CO2: 26 mmol/L (ref 20–29)
Calcium: 9.1 mg/dL (ref 8.6–10.2)
Chloride: 101 mmol/L (ref 96–106)
Creatinine, Ser: 1.24 mg/dL (ref 0.76–1.27)
Glucose: 165 mg/dL — ABNORMAL HIGH (ref 70–99)
Potassium: 4.1 mmol/L (ref 3.5–5.2)
Sodium: 140 mmol/L (ref 134–144)
eGFR: 61 mL/min/{1.73_m2} (ref >59)
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
## 2022-10-04 ENCOUNTER — Other Ambulatory Visit: Payer: Self-pay | Admitting: Urology

## 2022-10-04 ENCOUNTER — Telehealth: Payer: Self-pay | Admitting: Cardiovascular Disease

## 2022-10-04 ENCOUNTER — Ambulatory Visit: Payer: Medicare PPO | Attending: Cardiovascular Disease | Admitting: Pharmacist

## 2022-10-04 VITALS — BP 148/84 | HR 71 | Wt 299.0 lb

## 2022-10-04 DIAGNOSIS — I10 Essential (primary) hypertension: Secondary | ICD-10-CM | POA: Diagnosis not present

## 2022-10-04 MED ORDER — AMLODIPINE BESYLATE 5 MG PO TABS: 5.0000 mg | ORAL_TABLET | Freq: Every day | ORAL | 5 refills | Status: DC

## 2022-10-04 MED ORDER — VALSARTAN 320 MG PO TABS: 320.0000 mg | ORAL_TABLET | Freq: Every day | ORAL | 3 refills | Status: DC

## 2022-10-04 NOTE — Progress Notes (Unsigned)
Patient ID: [name] [name]                 DOB: November 23, 1945                      MRN: [id number]     HPI: [name] [name] is a 77 y.o. male referred by Dr. Johnsie Cancel to HTN clinic. PMH is significant for afib, HTN, gout, obesity, and prostate cancer. BP was elevated at 158/86 at last MD visit on 08/23/22. His valsartan was changed to valsartan-HCTZ with f/u BMET stable.   Pt presents today for follow up. Denies dizziness, headache, and LE edema. Has dealt with frequent urination due to his prostate cancer. Wears pads daily but using 2x as many now after starting on HCTZ which has been affecting his day to day activity with increased urination. Checks BP at home using bicep cuff, readings typically 140s/80. His wife previously brought cuff to MD office and reports cuff was measuring accurately. Takes his meds in the AM, checks BP at various times of day. Meds taken at 8am today. No NSAID use. Some caffeine, watches salt intake, stays active with walking.  Current HTN meds: valsartan-HCTZ 160-12.'5mg'$  daily (AM), Toprol '25mg'$  daily (AM)  BP goal: <130/20mHg  Family History: No family history on file.  Social History: Chew tobacco, no alcohol or drug use.  Diet:  2 diet Cokes a day, otherwise water. No other caffeine. Doesn't add salt to his food.  Exercise: Walks 30-45 minutes most days a week  Home BP readings:   Wt Readings from Last 3 Encounters:  08/23/22 294 lb 12.8 oz (133.7 kg)  05/23/22 295 lb 3.2 oz (133.9 kg)  05/02/22 294 lb (133.4 kg)   BP Readings from Last 3 Encounters:  08/23/22 (!) 158/86  05/23/22 (!) 156/101  05/02/22 (!) 190/81   Pulse Readings from Last 3 Encounters:  08/23/22 70  05/23/22 (!) 47  05/02/22 (!) 50    Renal function: CrCl cannot be calculated (Unknown ideal weight.).  Past Medical History:  Diagnosis Date   A-fib ([facility]    Arthritis    Frequency of urination    Hematuria    History of gout    History of kidney stones    Hypertension     Pneumonia 1981   [place]   [place]   Right ureteral stone    Urgency of urination    Wears glasses or contacts     Current Outpatient Medications on File Prior to Visit  Medication Sig Dispense Refill   acetaminophen (TYLENOL) 500 MG tablet Take 1,000 mg by mouth every 6 (six) hours as needed for moderate pain or headache.     allopurinol (ZYLOPRIM) 100 MG tablet Take 100 mg by mouth in the morning and at bedtime.     apixaban (ELIQUIS) 5 MG TABS tablet Take 1 tablet (5 mg total) by mouth 2 (two) times daily. 180 tablet 3   BACTRIM DS 800-160 MG tablet Take 3-4 tablets by mouth See admin instructions. For dental procedures Take 3-4 tablets by mouth 30 mins prior to dental work     fluticasone (FLONASE) 50 MCG/ACT nasal spray Place 1 spray into both nostrils daily as needed (sinus/allergies.).     hydrALAZINE (APRESOLINE) 50 MG tablet Take one tablet by mouth the night before and morning of your procedure 2 tablet 0   metoprolol succinate (TOPROL-XL) 25 MG 24 hr tablet Take 1 tablet (25 mg total) by  mouth daily. 90 tablet 3   mirabegron ER (MYRBETRIQ) 50 MG TB24 tablet Take 50 mg by mouth daily.     traMADol (ULTRAM) 50 MG tablet Take 50 mg by mouth every 12 (twelve) hours as needed for moderate pain.     valsartan-hydrochlorothiazide (DIOVAN HCT) 160-12.5 MG tablet Take 1 tablet by mouth daily. 90 tablet 3   No current facility-administered medications on file prior to visit.    No Known Allergies   Assessment/Plan:  1. Hypertension - BP improved but remains above goal < 130/33mHg. Will stop valsartan-HCTZ 160-12.'5mg'$  daily due to increased urination that's affecting pt's daily activities, and replace with higher dose of valsartan '320mg'$  daily. Will also add amlodipine '5mg'$  daily. He will continue on Toprol '25mg'$  daily (takes for rate control for afib). He'll continue to monitor BP at home and limit daily salt intake to '000mg'$ . He sees Dr NJohnsie Cancelfor follow up in  1 week. Will recheck BMET at that time. Can f/u with PharmD if additional BP lowering is needed at that time. Discussed with pt that it may take 1-2 weeks to see full effects of med changes today.  Anjannette Gauger E. Darrold Bezek, PharmD, BCACP, [place] [address] [place] [address]Phone: [phone] Fax: ([phone]/24/2024 10:58 AM

## 2022-10-04 NOTE — Patient Instructions (Addendum)
Your blood pressure goal is < 130/36mHg  Stop taking your valsartan-HCTZ combo pill  Go back to taking just valsartan, but at a higher dose of '320mg'$  daily. You can take 2 of your '160mg'$  tablets each day until you use them up, then the refill at the pharmacy will be for the higher '320mg'$  tablet to take 1 tablet daily  Start taking amlodipine '5mg'$  - 1 tablet daily  Continue to monitor your blood pressure at home  Continue with your walking. Aim for < 2,'000mg'$  daily sodium  Recheck blood pressure and labs when you see Dr NJohnsie Cancelin a week  If your blood pressure is still running high then, I'll give you a call after to discuss

## 2022-10-04 NOTE — Telephone Encounter (Signed)
    [facility] Pre-operative Risk Assessment    Request for surgical clearance:  What type of surgery is being performed?  Artifical Urinary Sphincter  When is this surgery scheduled?  11/21/22   What type of clearance is required (medical clearance vs. Pharmacy clearance to hold med vs. Both)?  Both   Are there any medications that need to be held prior to surgery and how long? Eliquis, 3 days prior    Practice name and name of physician performing surgery?  [facility]  Dr. Cain Sieve   What is your office phone number [phone] (ext#: [phone])    7.   What is your office fax number? [phone]  8.   Anesthesia type (None, local, MAC, general)? General    [name] [name] 10/04/2022, 4:01 PM

## 2022-10-06 NOTE — Telephone Encounter (Signed)
Patient with diagnosis of afib on Eliquis for anticoagulation.    Procedure: Artifical Urinary Sphincter  Date of procedure: 11/21/22  CHA2DS2-VASc Score = 2  This indicates a 2.2% annual risk of stroke. The patient's score is based upon: CHF History: 0 HTN History: 1 Diabetes History: 0 Stroke History: 0 Vascular Disease History: 0 Age Score: 1 Gender Score: 0   CrCl 40m/min using adjusted body weight Platelet count 196K  Per office protocol, patient can hold Eliquis for 2-3 days prior to procedure.    **This guidance is not considered finalized until pre-operative APP has relayed final recommendations.**

## 2022-10-09 NOTE — Telephone Encounter (Signed)
Patient is scheduled to see Dr. Johnsie Cancel on 10/11/22 at which time request for surgical clearance can be addressed. Note routed to Dr. Johnsie Cancel for his awareness. I will remove clearance from the preop pool.    [name] Life, NP-C  10/09/2022, 10:00 AM [address], [address] Office [phone] Fax [phone]

## 2022-10-11 ENCOUNTER — Ambulatory Visit: Payer: Medicare PPO

## 2022-10-11 ENCOUNTER — Encounter: Payer: Self-pay | Admitting: Cardiovascular Disease

## 2022-10-11 ENCOUNTER — Ambulatory Visit: Payer: Medicare PPO | Attending: Cardiovascular Disease | Admitting: Cardiovascular Disease

## 2022-10-11 VITALS — BP 128/86 | HR 63 | Ht 78.0 in | Wt 304.0 lb

## 2022-10-11 DIAGNOSIS — I10 Essential (primary) hypertension: Secondary | ICD-10-CM | POA: Diagnosis not present

## 2022-10-11 DIAGNOSIS — I712 Thoracic aortic aneurysm, without rupture, unspecified: Secondary | ICD-10-CM

## 2022-10-11 DIAGNOSIS — D6869 Other thrombophilia: Secondary | ICD-10-CM

## 2022-10-11 DIAGNOSIS — I4819 Other persistent atrial fibrillation: Secondary | ICD-10-CM | POA: Diagnosis not present

## 2022-10-11 NOTE — Patient Instructions (Addendum)
Medication Instructions:  Your physician recommends that you continue on your current medications as directed. Please refer to the Current Medication list given to you today.  *If you need a refill on your cardiac medications before your next appointment, please call your pharmacy*  Lab Work: Your physician recommends that you have lab work today- BMET If you have labs (blood work) drawn today and your tests are completely normal, you will receive your results only by: MyChart Message (if you have MyChart) OR A paper copy in the mail If you have any lab test that is abnormal or we need to change your treatment, we will call you to review the results.  Testing/Procedures: Your physician has requested that you have cardiac CT. Cardiac computed tomography (CT) is a painless test that uses an x-ray machine to take clear, detailed pictures of your heart. For further information please visit [email or web address]. Please follow instruction sheet as given.  Follow-Up: At [facility], you and your health needs are our priority.  As part of our continuing mission to provide you with exceptional heart care, we have created designated Provider Care Teams.  These Care Teams include your primary Cardiologist (physician) and Advanced Practice Providers (APPs -  Physician Assistants and Nurse Practitioners) who all work together to provide you with the care you need, when you need it.  We recommend signing up for the patient portal called "MyChart".  Sign up information is provided on this After Visit Summary.  MyChart is used to connect with patients for Virtual Visits (Telemedicine).  Patients are able to view lab/test results, encounter notes, upcoming appointments, etc.  Non-urgent messages can be sent to your provider as well.   To learn more about what you can do with MyChart, go to [email or web address].    Your next appointment:   6 month(s)  Provider:   Jenkins Rouge, MD     Other  Instructions   Your cardiac CT will be scheduled at one of the below locations:   [facility] [address] [place], [place] [address] [phone]  If scheduled at [facility], please arrive at the [facility] Entrance (Entrance C2) of [facility] 30 minutes prior to test start time. You can use the FREE valet parking offered at entrance C (encouraged to control the heart rate for the test)  Proceed to the [facility] Radiology Department (first floor) to check-in and test prep.  All radiology patients and guests should use entrance C2 at [facility], accessed from [facility], even though the hospital's physical address listed is [address].      Please follow these instructions carefully (unless otherwise directed):  Hold all erectile dysfunction medications at least 3 days (72 hrs) prior to test. (Ie viagra, cialis, sildenafil, tadalafil, etc) We will administer nitroglycerin during this exam.   On the Night Before the Test: Be sure to Drink plenty of water. Do not consume any caffeinated/decaffeinated beverages or chocolate 12 hours prior to your test. Do not take any antihistamines 12 hours prior to your test.  On the Day of the Test: Drink plenty of water until 1 hour prior to the test. Do not eat any food 1 hour prior to test. You may take your regular medications prior to the test.  Take metoprolol (Lopressor) two hours prior to test.      After the Test: Drink plenty of water. After receiving IV contrast, you may experience a mild flushed  feeling. This is normal. On occasion, you may experience a mild rash up to 24 hours after the test. This is not dangerous. If this occurs, you can take Benadryl 25 mg and increase your fluid intake. If you experience trouble breathing, this can be serious. If it is severe call 911 IMMEDIATELY. If it is mild, please call our office.  We will call to schedule  your test 2-4 weeks out understanding that some insurance companies will need an authorization prior to the service being performed.   For non-scheduling related questions, please contact the cardiac imaging nurse navigator should you have any questions/concerns: Marchia Bond, Cardiac Imaging Nurse Navigator Gordy Clement, Cardiac Imaging Nurse Navigator [facility] Direct Office Dial: [phone]   For scheduling needs, including cancellations and rescheduling, please call [place], [phone].

## 2022-10-12 ENCOUNTER — Ambulatory Visit: Payer: Medicare PPO | Attending: Cardiovascular Disease

## 2022-10-12 DIAGNOSIS — I10 Essential (primary) hypertension: Secondary | ICD-10-CM

## 2022-10-12 LAB — BASIC METABOLIC PANEL
BUN/Creatinine Ratio: 13 (ref 10–24)
BUN: 15 mg/dL (ref 8–27)
CO2: 25 mmol/L (ref 20–29)
Calcium: 9.2 mg/dL (ref 8.6–10.2)
Chloride: 102 mmol/L (ref 96–106)
Creatinine, Ser: 1.12 mg/dL (ref 0.76–1.27)
Glucose: 164 mg/dL — ABNORMAL HIGH (ref 70–99)
Potassium: 4.2 mmol/L (ref 3.5–5.2)
Sodium: 141 mmol/L (ref 134–144)
eGFR: 69 mL/min/{1.73_m2} (ref >59)

## 2022-10-16 ENCOUNTER — Telehealth: Payer: Self-pay | Admitting: Pharmacist

## 2022-10-16 NOTE — Telephone Encounter (Signed)
BMET stable after changing valsartan-HCTZ 160-12.'5mg'$  daily to valsartan '320mg'$  daily, also added amlodipine '5mg'$  daily same day (1/24 PharmD visit). BP improved to 128/86 at f/u appt with Dr Johnsie Cancel on 10/11/22. Will continue current meds.  Called pt, home # busy, will try back later.

## 2022-10-17 NOTE — Telephone Encounter (Signed)
The patient was recently seen. Unsure if clearance was given. Please advise!

## 2022-10-17 NOTE — Telephone Encounter (Signed)
   Patient Name: [name] [name]  DOB: [birth year] MRN: [id number]  Primary Cardiologist: Jenkins Rouge, MD  Chart reviewed as part of pre-operative protocol coverage. Given past medical history and time since last visit, per Dr. Johnsie Cancel, primary cardiologist, and based on ACC/AHA guidelines, KETAN RENZ is at acceptable risk for the planned procedure without further cardiovascular testing.    Per office protocol, patient can hold Eliquis for 2-3 days prior to procedure.  Please resume Eliquis as soon as possible postprocedure, at the discretion of the surgeon.   I will route this recommendation to the requesting party via Epic fax function and remove from pre-op pool.  Please call with questions.  Lenna Sciara, NP 10/17/2022, 3:45 PM

## 2022-10-17 NOTE — Telephone Encounter (Signed)
Butch Penny with [facility] calling to follow up on clearance.

## 2022-10-18 NOTE — Telephone Encounter (Signed)
Pt returned call, tolerating BP meds well. Has "a touch of swelling" in his ankles, not bothersome currently. Will continue current meds as BP was right at his goal at visit with Dr Johnsie Cancel the other day. He will call if swelling worsens or becomes bothersome and could decrease amlodipine dose if needed, would need to monitor for BP increase though. Also encouraged him to watch intake of sodium.

## 2022-10-18 NOTE — Telephone Encounter (Signed)
Home # still busy, not sure if there's an issue with that line. Called pt's cell and left a message.

## 2022-10-19 ENCOUNTER — Encounter (HOSPITAL_COMMUNITY): Payer: Self-pay | Admitting: *Deleted

## 2022-10-25 ENCOUNTER — Ambulatory Visit (HOSPITAL_COMMUNITY)
Admission: RE | Admit: 2022-10-25 | Discharge: 2022-10-25 | Disposition: A | Discharge status: Home / Self Care | Payer: Medicare PPO | Source: Ambulatory Visit | Attending: Cardiovascular Disease | Admitting: Cardiovascular Disease

## 2022-10-25 DIAGNOSIS — I4819 Other persistent atrial fibrillation: Secondary | ICD-10-CM | POA: Diagnosis present

## 2022-10-25 DIAGNOSIS — I10 Essential (primary) hypertension: Secondary | ICD-10-CM | POA: Insufficient documentation

## 2022-10-25 DIAGNOSIS — I712 Thoracic aortic aneurysm, without rupture, unspecified: Secondary | ICD-10-CM | POA: Insufficient documentation

## 2022-10-25 MED ORDER — IOHEXOL 350 MG/ML SOLN
100.0000 mL | Freq: Once | INTRAVENOUS | Status: AC | PRN
  Administered 2022-10-25: 100 mL via INTRAVENOUS

## 2022-10-30 ENCOUNTER — Telehealth: Payer: Self-pay | Admitting: Cardiovascular Disease

## 2022-10-30 NOTE — Telephone Encounter (Signed)
The patient has been notified of the result and verbalized understanding.  All questions (if any) were answered. Gershon Crane, LPN [id number] [phone] AM

## 2022-10-30 NOTE — Telephone Encounter (Signed)
Pt returning call for CT results

## 2022-11-13 ENCOUNTER — Telehealth: Payer: Self-pay | Admitting: Cardiovascular Disease

## 2022-11-13 NOTE — Telephone Encounter (Signed)
Pt c/o swelling: STAT is pt has developed SOB within 24 hours  If swelling, where is the swelling located? Ankles   How much weight have you gained and in what time span? Unsure  Have you gained 3 pounds in a day or 5 pounds in a week? Has not weighed   Do you have a log of your daily weights (if so, list)? No has not weighed   Are you currently taking a fluid pill? Yes  Are you currently SOB? No   Have you traveled recently? No  Swelling in ankles over past week, especially in the last two days.  Mentioned he was advised this may occur due to a recent medication he was put on.

## 2022-11-13 NOTE — Telephone Encounter (Addendum)
Pt started on amlodipine 10/04/22 when HCTZ 12.'5mg'$  daily was discontinued secondary to frequent urination that was affecting his day to day activity (incontinence worse due to his prostate cancer history).  BP elevated at home while on amlodipine, would increase even more with amlodipine d/c.  Swelling is dose-dependent, will try decreasing amlodipine to 2.'5mg'$  daily and changing Toprol '25mg'$  daily to comparable dose of carvedilol 3.'125mg'$  BID for better BP lowering.   Otherwise, spironolactone would likely cause similar issue as thiazide with regards to urination and next option would be hydralazine but TID dosing is inconvenient.  Called pt, his wife states he's out now but will be back in a few hours. Will try him back then.

## 2022-11-13 NOTE — Telephone Encounter (Signed)
Called pt again, wife stated he's still out and to call his cell. Called his cell and left a message.

## 2022-11-13 NOTE — Telephone Encounter (Signed)
Pt stated he started taking Amlodipine last month,however he expressed concerns previously he has this medication before and it caused swelling. Today the patient called stating his ankles are swollen and he will like to know if it's safe to discontinue to medication. BP was not monitored today, yesterday's reading 145/82. Denies any other symptoms. MD and nurse are not in the office, will forward to PharmD.

## 2022-11-14 MED ORDER — CARVEDILOL 3.125 MG PO TABS: 3.1250 mg | ORAL_TABLET | Freq: Two times a day (BID) | ORAL | 3 refills | Status: DC

## 2022-11-14 MED ORDER — AMLODIPINE BESYLATE 2.5 MG PO TABS: 2.5000 mg | ORAL_TABLET | Freq: Every day | ORAL | 3 refills | Status: DC

## 2022-11-14 NOTE — Progress Notes (Signed)
COVID Vaccine received:  '[]'$  No '[x]'$  Yes Date of any COVID positive Test in last 61 days:None  PCP - Kathryne Eriksson, MD at [facility],  [phone] Fax) [phone] Cardiologist - Jenkins Rouge, MD  Chest x-ray -  EKG -  10-11-2022  epic Stress Test -  ECHO - 07-16-2020  epic Cardiac Cath -  CTA Chest Aorta- 10-25-2022  epic  PCR screen: '[]'$  Ordered & Completed                      '[]'$   No Order but Needs PROFEND                      '[x]'$   N/A for this surgery  Surgery Plan:  '[]'$  Ambulatory                            '[x]'$  Outpatient in bed                            '[]'$  Admit  Anesthesia:    '[x]'$  General  '[]'$  Spinal                           '[]'$   Choice '[]'$   MAC  Bowel Prep - '[x]'$  No  '[]'$   Yes ______  Pacemaker / ICD device '[x]'$  No '[]'$  Yes        Device order form faxed '[x]'$  No    '[]'$   Yes      Faxed to:  Spinal Cord Stimulator:'[x]'$  No '[]'$  Yes      (Remind patient to bring remote DOS) Other Implants:   History of Sleep Apnea? '[x]'$  No '[]'$  Yes   CPAP used?- '[x]'$  No '[]'$  Yes    Does the patient monitor blood sugar? '[]'$  No '[]'$  Yes  '[x]'$  N/A  Blood Thinner / Instructions:  Eliquis  Hold 2-3 days as per Diona Browner, NP for Dr. Johnsie Cancel. 10-04-22 telephone note.  Patient is aware of holding Eliquis.  Aspirin Instructions: none  ERAS Protocol Ordered: '[x]'$  No  '[]'$  Yes Patient is to be NPO after: Midnight prior  Activity level: Patient is able to climb a flight of stairs without difficulty; '[x]'$  No CP  '[x]'$  No SOB.  Patient can perform ADLs without assistance.   Anesthesia review: HTN, A.Fib- failed cardioversion 10-14-2020, gout, hx prostate ca, TAA  COMMENTS: a Urine culture was ordered by Dr. Cain Sieve to be done at PST. The patient tried for over 10 minutes and was not able to get enough urine to culture. He stated that he tried yesterday while at [facility] appt and was only able to give a small amount of urine at that time. Called [facility].   Patient denies shortness of breath, fever, cough and  chest pain at PAT appointment.  Patient verbalized understanding and agreement to the Pre-Surgical Instructions that were given to them at this PAT appointment. Patient was also educated of the need to review these PAT instructions again prior to his/her surgery.I reviewed the appropriate phone numbers to call if they have any and questions or concerns.

## 2022-11-14 NOTE — Telephone Encounter (Signed)
Called pt again, reports swelling is a bit worse the longer he's been on amlodipine, BP around 140/80.  Will decrease his amlodipine to 2.'5mg'$  daily and replace his Toprol '25mg'$  with carvedilol 3.'125mg'$  BID. Pt aware to continue monitoring BP, HR, and swelling at home. Also advised to limit sodium in his diet and can elevate his legs to help with swelling.  I'll call pt in 2 weeks for an update. Advised him to call clinic with any concerns prior to this. Pt appreciative for the call.

## 2022-11-14 NOTE — Patient Instructions (Signed)
SURGICAL WAITING ROOM VISITATION Patients having surgery or a procedure may have no more than 2 support people in the waiting area - these visitors may rotate in the visitor waiting room.   Due to an increase in RSV and influenza rates and associated hospitalizations, children ages 41 and under may not visit patients in [place]. If the patient needs to stay at the hospital during part of their recovery, the visitor guidelines for inpatient rooms apply.  PRE-OP VISITATION  Pre-op nurse will coordinate an appropriate time for 1 support person to accompany the patient in pre-op.  This support person may not rotate.  This visitor will be contacted when the time is appropriate for the visitor to come back in the pre-op area.  Please refer to the [facility] website for the visitor guidelines for Inpatients (after your surgery is over and you are in a regular room).  You are not required to quarantine at this time prior to your surgery. However, you must do this: Hand Hygiene often Do NOT share personal items Notify your provider if you are in close contact with someone who has COVID or you develop fever 100.4 or greater, new onset of sneezing, cough, sore throat, shortness of breath or body aches.  If you test positive for Covid or have been in contact with anyone that has tested positive in the last 10 days please notify you surgeon.    Your procedure is scheduled on: Tuesday November 21, 2022   Report to [facility] Main Entrance: [place] entrance where the [employer] is available.   Report to admitting at: 05:15  AM  +++++Call this number if you have any questions or problems the morning of surgery [phone]  DO NOT EAT OR DRINK ANYTHING AFTER MIDNIGHT THE NIGHT PRIOR TO YOUR SURGERY / PROCEDURE.   FOLLOW BOWEL PREP AND ANY ADDITIONAL PRE OP INSTRUCTIONS YOU RECEIVED FROM YOUR SURGEON'S OFFICE!!!   Oral Hygiene is also important to reduce your risk of infection.         Remember - BRUSH YOUR TEETH THE MORNING OF SURGERY WITH YOUR REGULAR TOOTHPASTE  Do NOT smoke after Midnight the night before surgery.  Take ONLY these medicines the morning of surgery with A SIP OF WATER: carvedilol (Coreg), amlodipine, Allopurinol,  If needed, you may take Tramadol and use your Flonase nasal spray.   You may not have any metal on your body including  jewelry, and body piercing  Do not wear  lotions, powders, cologne, or deodorant  Men may shave face and neck.  Contacts, Hearing Aids, dentures or bridgework may not be worn into surgery.   You may bring a small overnight bag with you on the day of surgery, only pack items that are not valuable. [facility] IS NOT RESPONSIBLE   FOR VALUABLES THAT ARE LOST OR STOLEN.   Do not bring your home medications to the hospital. The Pharmacy will dispense medications listed on your medication list to you during your admission in the Hospital.  Special Instructions: Bring a copy of your healthcare power of attorney and living will documents the day of surgery, if you wish to have them scanned into your [facility] Medical Records- EPIC  Please read over the following fact sheets you were given: IF YOU HAVE QUESTIONS ABOUT YOUR [place], [place] [phone].   [place] - Preparing for Surgery Before surgery, you can play an important role.  Because skin is not sterile, your skin needs to  be as free of germs as possible.  You can reduce the number of germs on your skin by washing with CHG (chlorahexidine gluconate) soap before surgery.  CHG is an antiseptic cleaner which kills germs and bonds with the skin to continue killing germs even after washing. Please DO NOT use if you have an allergy to CHG or antibacterial soaps.  If your skin becomes reddened/irritated stop using the CHG and inform your nurse when you arrive at Short Stay. Do not shave (including legs and underarms) for at least 48 hours prior to the  first CHG shower.  You may shave your face/neck.  Please follow these instructions carefully:  1.  Shower with CHG Soap the night before surgery and the  morning of surgery.  2.  If you choose to wash your hair, wash your hair first as usual with your normal  shampoo.  3.  After you shampoo, rinse your hair and body thoroughly to remove the shampoo.                             4.  Use CHG as you would any other liquid soap.  You can apply chg directly to the skin and wash.  Gently with a scrungie or clean washcloth.  5.  Apply the CHG Soap to your body ONLY FROM THE NECK DOWN.   Do not use on face/ open                           Wound or open sores. Avoid contact with eyes, ears mouth and genitals (private parts).                       Wash face,  Genitals (private parts) with your normal soap.             6.  Wash thoroughly, paying special attention to the area where your  surgery  will be performed.  7.  Thoroughly rinse your body with warm water from the neck down.  8.  DO NOT shower/wash with your normal soap after using and rinsing off the CHG Soap.            9.  Pat yourself dry with a clean towel.            10.  Wear clean pajamas.            11.  Place clean sheets on your bed the night of your first shower and do not  sleep with pets.  ON THE DAY OF SURGERY : Do not apply any lotions/deodorants the morning of surgery.  Please wear clean clothes to the hospital/surgery center.    FAILURE TO FOLLOW THESE INSTRUCTIONS MAY RESULT IN THE CANCELLATION OF YOUR SURGERY  PATIENT SIGNATURE_________________________________  NURSE SIGNATURE__________________________________  ________________________________________________________________________

## 2022-11-15 ENCOUNTER — Encounter (HOSPITAL_COMMUNITY)
Admission: RE | Admit: 2022-11-15 | Discharge: 2022-11-15 | Disposition: A | Discharge status: Home / Self Care | Payer: Medicare PPO | Source: Ambulatory Visit | Attending: Urology | Admitting: Urology

## 2022-11-15 ENCOUNTER — Other Ambulatory Visit: Payer: Self-pay

## 2022-11-15 ENCOUNTER — Encounter (HOSPITAL_COMMUNITY): Payer: Self-pay

## 2022-11-15 VITALS — BP 143/77 | HR 60 | Temp 97.8°F | Resp 20 | Ht 76.0 in | Wt 294.0 lb

## 2022-11-15 DIAGNOSIS — I728 Aneurysm of other specified arteries: Secondary | ICD-10-CM | POA: Diagnosis not present

## 2022-11-15 DIAGNOSIS — Z8546 Personal history of malignant neoplasm of prostate: Secondary | ICD-10-CM | POA: Diagnosis not present

## 2022-11-15 DIAGNOSIS — K760 Fatty (change of) liver, not elsewhere classified: Secondary | ICD-10-CM | POA: Insufficient documentation

## 2022-11-15 DIAGNOSIS — I10 Essential (primary) hypertension: Secondary | ICD-10-CM

## 2022-11-15 DIAGNOSIS — Z01812 Encounter for preprocedural laboratory examination: Secondary | ICD-10-CM | POA: Insufficient documentation

## 2022-11-15 DIAGNOSIS — N393 Stress incontinence (female) (male): Secondary | ICD-10-CM | POA: Diagnosis not present

## 2022-11-15 DIAGNOSIS — I351 Nonrheumatic aortic (valve) insufficiency: Secondary | ICD-10-CM | POA: Diagnosis not present

## 2022-11-15 DIAGNOSIS — I4891 Unspecified atrial fibrillation: Secondary | ICD-10-CM | POA: Insufficient documentation

## 2022-11-15 DIAGNOSIS — I119 Hypertensive heart disease without heart failure: Secondary | ICD-10-CM | POA: Insufficient documentation

## 2022-11-15 HISTORY — DX: Cardiac arrhythmia, unspecified: I49.9

## 2022-11-15 LAB — BASIC METABOLIC PANEL
Anion gap: 3 — ABNORMAL LOW (ref 5–15)
BUN: 17 mg/dL (ref 8–23)
CO2: 26 mmol/L (ref 22–32)
Calcium: 8.5 mg/dL — ABNORMAL LOW (ref 8.9–10.3)
Chloride: 106 mmol/L (ref 98–111)
Creatinine, Ser: 1.1 mg/dL (ref 0.61–1.24)
GFR, Estimated: >60 mL/min (ref >60)
Glucose, Bld: 130 mg/dL — ABNORMAL HIGH (ref 70–99)
Potassium: 4 mmol/L (ref 3.5–5.1)
Sodium: 135 mmol/L (ref 135–145)

## 2022-11-15 LAB — CBC
HCT: 47.7 % (ref 39.0–52.0)
Hemoglobin: 15.9 g/dL (ref 13.0–17.0)
MCH: 32.1 pg (ref 26.0–34.0)
MCHC: 33.3 g/dL (ref 30.0–36.0)
MCV: 96.2 fL (ref 80.0–100.0)
Platelets: 180 10*3/uL (ref 150–400)
RBC: 4.96 MIL/uL (ref 4.22–5.81)
RDW: 13.7 % (ref 11.5–15.5)
WBC: 6.3 10*3/uL (ref 4.0–10.5)
nRBC: 0 % (ref 0.0–0.2)

## 2022-11-16 NOTE — Anesthesia Preprocedure Evaluation (Addendum)
Anesthesia Evaluation  Patient identified by MRN, date of birth, ID band Patient awake    Reviewed: Allergy & Precautions, H&P , NPO status , Patient's Chart, lab work & pertinent test results  Airway Mallampati: II   Neck ROM: full    Dental   Pulmonary neg pulmonary ROS   breath sounds clear to auscultation       Cardiovascular hypertension, + dysrhythmias Atrial Fibrillation  Rhythm:irregular Rate:Normal  TTE (07/2020): EF 55-60%, mild AI.   Neuro/Psych    GI/Hepatic   Endo/Other    Renal/GU    H/o prostate CA    Musculoskeletal  (+) Arthritis ,    Abdominal   Peds  Hematology   Anesthesia Other Findings   Reproductive/Obstetrics                             Anesthesia Physical Anesthesia Plan  ASA: 3  Anesthesia Plan: General   Post-op Pain Management:    Induction: Intravenous  PONV Risk Score and Plan: 2 and Ondansetron, Dexamethasone and Treatment may vary due to age or medical condition  Airway Management Planned: Oral ETT  Additional Equipment:   Intra-op Plan:   Post-operative Plan: Extubation in OR  Informed Consent: I have reviewed the patients History and Physical, chart, labs and discussed the procedure including the risks, benefits and alternatives for the proposed anesthesia with the patient or authorized representative who has indicated his/her understanding and acceptance.     Dental advisory given  Plan Discussed with: CRNA, Anesthesiologist and Surgeon  Anesthesia Plan Comments: (See PAT note 11/15/2022)       Anesthesia Quick Evaluation

## 2022-11-16 NOTE — Progress Notes (Signed)
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 Terrilee Files.   Pt last seen by cardiology 10/11/2022. Per cardiology preoperative evaluation 10/17/2022, "Chart reviewed as part of pre-operative protocol coverage. Given past medical history and time since last visit, per Dr. Johnsie Cancel, primary cardiologist, and based on ACC/AHA guidelines, ONEILL EREKSON is at acceptable risk for the planned procedure without further cardiovascular testing.   Per office protocol, patient can hold Eliquis for 2-3 days prior to procedure.  Please resume Eliquis as soon as possible postprocedure, at the discretion of the surgeon."  Anticipate pt can proceed with planned procedure barring acute status change.   VS: BP (!) 143/77 Comment: right arm sitting  Pulse 60   Temp 36.6 C (Oral)   Resp 20   Ht '6\' 4"'$  (1.93 m)   Wt 133.4 kg   SpO2 96%   BMI 35.79 kg/m   PROVIDERS: [name], MD is PCP   Cardiologist - Jenkins Rouge, MD  LABS: Labs reviewed: Acceptable for surgery. (all labs ordered are listed, but only abnormal results are displayed)  Labs Reviewed  BASIC METABOLIC PANEL - Abnormal; Notable for the following components:      Result Value   Glucose, Bld 130 (*)    Calcium 8.5 (*)    Anion gap 3 (*)    All other components within normal limits  URINE CULTURE  CBC     IMAGES: CT Angio Chest 10/25/2022 IMPRESSION: 1. 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 2. 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 Mild  dilatation of the aortic arch and proximal descending thoracic aorta. 3. 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 4. Diffuse hepatic steatosis. 5. Nonspecific heterogeneity of the left thyroid lobe. Recommend thyroid ultrasound (ref: J Am Coll Radiol. 2015 Feb;12(2): 143-50).  EKG:   CV: Echo 07/16/2020 1. Left ventricular ejection fraction, by estimation, is 55 to 60%. The / left ventricle has normal function. The left ventricle has no regional  wall motion abnormalities. There is moderate concentric left ventricular  hypertrophy. Left ventricular  diastolic parameters are indeterminate.   2. Right ventricular systolic function is normal. The right ventricular  size is normal.   3. Left atrial size was mildly dilated.   4. The mitral valve is normal in structure. Trivial mitral valve  regurgitation.   5. The aortic valve is tricuspid. Aortic valve regurgitation is mild.   6. Aortic dilatation noted. There is mild dilatation of the aortic root,  measuring 40 mm. There is moderate dilatation of the ascending aorta,  measuring 45 mm.   7. The inferior vena cava is normal in size with greater than 50%  respiratory variability, suggesting right atrial pressure of 3 mmHg.  Past Medical History:  Diagnosis Date   A-fib [facility])    Arthritis    Dysrhythmia    A.  fib   Frequency of urination    Hematuria    History of gout    History of kidney  stones    Hypertension    Pneumonia 1981   History of   Prostate cancer ([place]) 07/2020   Right ureteral stone    Urgency of urination    Wears glasses or contacts     Past Surgical History:  Procedure Laterality Date   ACHILLES TENDON SURGERY Left    BOTOX INJECTION N/A 05/23/2022   Procedure: CYSTOSCOPY BOTOX INJECTION;  Surgeon: Bjorn Loser, MD;  Location: [facility];  Service: Urology;  Laterality: N/A;   CARDIOVERSION N/A 10/14/2020   Procedure: CARDIOVERSION;  Surgeon: Elouise Munroe,  MD;  Location[facility] ENDOSCOPY;  Service: Cardiovascular;  Laterality: N/A;   CYST EXCISION     finger   CYSTO/  RETROGRADE PYELOGRAM/ RIGHT URETERAL PLACEMENT  04/04/2013   CYSTOGRAM N/A 05/23/2022   Procedure: CYSTOGRAM;  Surgeon: Bjorn Loser, MD;  Location: [facility];  Service: Urology;  Laterality: N/A;   CYSTOSCOPY WITH RETROGRADE PYELOGRAM, URETEROSCOPY AND STENT PLACEMENT Right 04/04/2013   Procedure: CYSTOSCOPY WITH RIGHT RETROGRADE PYELOGRAM, AND RIGHT STENT PLACEMENT, DIAGNOSTIC RIGHT URETEROSCOPY;  Surgeon: Alexis Frock, MD;  Location[facility];  Service: Urology;  Laterality: Right;   CYSTOSCOPY WITH URETEROSCOPY AND STENT PLACEMENT Right 05/07/2013   Procedure: CYSTOSCOPY WITH URETEROSCOPY AND STENT PLACEMENT;  Surgeon: Alexis Frock, MD;  Location: [facility];  Service: Urology;  Laterality: Right;   HOLMIUM LASER APPLICATION Right 0000000   Procedure: HOLMIUM LASER APPLICATION;  Surgeon: Alexis Frock, MD;  Location: [facility];  Service: Urology;  Laterality: Right;   KNEE ARTHROSCOPY W/ MENISCECTOMY Right    LAPAROSCOPIC CHOLECYSTECTOMY  2003   LYMPHADENECTOMY Bilateral 07/21/2020   Procedure: LYMPHADENECTOMY;  Surgeon: Alexis Frock, MD;  Location[facility];  Service: Urology;  Laterality: Bilateral;   ROBOT ASSISTED LAPAROSCOPIC RADICAL PROSTATECTOMY N/A 07/21/2020   Procedure: XI ROBOTIC ASSISTED LAPAROSCOPIC RADICAL PROSTATECTOMY, BILATERAL INGUINAL HERNIA REPAIR;  Surgeon: Alexis Frock, MD;  Location: [facility];  Service: Urology;  Laterality: N/A;  3 HRS   TOTAL KNEE ARTHROPLASTY  10/08/2012   Procedure: TOTAL KNEE ARTHROPLASTY;  Surgeon: Sydnee Cabal, MD;  Location: [facility];  Service: Orthopedics;  Laterality: Right;    MEDICATIONS:  acetaminophen (TYLENOL) 500 MG tablet   allopurinol (ZYLOPRIM) 300 MG tablet   amLODipine (NORVASC) 2.5 MG tablet   apixaban (ELIQUIS) 5 MG TABS tablet   BACTRIM DS 800-160 MG  tablet   carvedilol (COREG) 3.125 MG tablet   fluticasone (FLONASE) 50 MCG/ACT nasal spray   traMADol (ULTRAM) 50 MG tablet   valsartan (DIOVAN) 320 MG tablet   No current facility-administered medications for this encounter.   [name] Ward, PA-C [facility] Pre-Surgical Testing [phone]

## 2022-11-21 ENCOUNTER — Other Ambulatory Visit: Payer: Self-pay

## 2022-11-21 ENCOUNTER — Encounter (HOSPITAL_COMMUNITY): Payer: Self-pay | Admitting: Urology

## 2022-11-21 ENCOUNTER — Encounter (HOSPITAL_COMMUNITY)
Admission: RE | Disposition: A | Discharge status: Home / Self Care | Payer: Self-pay | Source: Home / Self Care | Attending: Urology

## 2022-11-21 ENCOUNTER — Observation Stay (HOSPITAL_COMMUNITY)
Admission: RE | Admit: 2022-11-21 | Discharge: 2022-11-22 | Disposition: A | Discharge status: Home / Self Care | Payer: Medicare PPO | Attending: Urology | Admitting: Urology

## 2022-11-21 ENCOUNTER — Ambulatory Visit (HOSPITAL_BASED_OUTPATIENT_CLINIC_OR_DEPARTMENT_OTHER): Payer: Medicare PPO | Admitting: Anesthesiology

## 2022-11-21 ENCOUNTER — Ambulatory Visit (HOSPITAL_COMMUNITY): Payer: Medicare PPO | Admitting: Physician Assistant

## 2022-11-21 DIAGNOSIS — N393 Stress incontinence (female) (male): Secondary | ICD-10-CM

## 2022-11-21 DIAGNOSIS — Z8546 Personal history of malignant neoplasm of prostate: Secondary | ICD-10-CM | POA: Diagnosis not present

## 2022-11-21 DIAGNOSIS — Z87891 Personal history of nicotine dependence: Secondary | ICD-10-CM | POA: Diagnosis not present

## 2022-11-21 DIAGNOSIS — I10 Essential (primary) hypertension: Secondary | ICD-10-CM

## 2022-11-21 DIAGNOSIS — Z96652 Presence of left artificial knee joint: Secondary | ICD-10-CM | POA: Insufficient documentation

## 2022-11-21 DIAGNOSIS — Z79899 Other long term (current) drug therapy: Secondary | ICD-10-CM | POA: Insufficient documentation

## 2022-11-21 DIAGNOSIS — Z7901 Long term (current) use of anticoagulants: Secondary | ICD-10-CM | POA: Insufficient documentation

## 2022-11-21 DIAGNOSIS — I4891 Unspecified atrial fibrillation: Secondary | ICD-10-CM | POA: Diagnosis not present

## 2022-11-21 DIAGNOSIS — Z960 Presence of urogenital implants: Secondary | ICD-10-CM

## 2022-11-21 HISTORY — PX: URINARY SPHINCTER IMPLANT: SHX2624

## 2022-11-21 SURGERY — INSERTION, ARTIFICIAL URINARY SPHINCTER
Anesthesia: General

## 2022-11-21 MED ORDER — OXYCODONE HCL 5 MG PO TABS
5.0000 mg | ORAL_TABLET | Freq: Once | ORAL | Status: AC | PRN
  Administered 2022-11-21: 5 mg via ORAL

## 2022-11-21 MED ORDER — EPHEDRINE 5 MG/ML INJ
INTRAVENOUS | Status: AC
  Filled 2022-11-21: qty 5

## 2022-11-21 MED ORDER — MIDAZOLAM HCL 2 MG/2ML IJ SOLN
INTRAMUSCULAR | Status: AC
  Filled 2022-11-21: qty 2

## 2022-11-21 MED ORDER — VANCOMYCIN HCL 1500 MG/300ML IV SOLN
1500.0000 mg | Freq: Once | INTRAVENOUS | Status: AC
  Administered 2022-11-21: 1500 mg via INTRAVENOUS
  Filled 2022-11-21: qty 300

## 2022-11-21 MED ORDER — OXYCODONE HCL 5 MG PO TABS: 5.0000 mg | ORAL_TABLET | ORAL | 0 refills | Status: AC | PRN

## 2022-11-21 MED ORDER — DEXAMETHASONE SODIUM PHOSPHATE 10 MG/ML IJ SOLN
INTRAMUSCULAR | Status: AC
  Filled 2022-11-21: qty 1

## 2022-11-21 MED ORDER — CHLORHEXIDINE GLUCONATE 4 % EX LIQD: Freq: Once | CUTANEOUS | Status: DC

## 2022-11-21 MED ORDER — SODIUM CHLORIDE 0.45 % IV SOLN
INTRAVENOUS | Status: DC
  Administered 2022-11-21: 50 mL/h via INTRAVENOUS

## 2022-11-21 MED ORDER — HEMOSTATIC AGENTS (NO CHARGE) OPTIME
TOPICAL | Status: DC | PRN
  Administered 2022-11-21: 1 via TOPICAL

## 2022-11-21 MED ORDER — FENTANYL CITRATE (PF) 100 MCG/2ML IJ SOLN
INTRAMUSCULAR | Status: AC
  Filled 2022-11-21: qty 2

## 2022-11-21 MED ORDER — KETOROLAC TROMETHAMINE 30 MG/ML IJ SOLN
INTRAMUSCULAR | Status: AC
  Filled 2022-11-21: qty 1

## 2022-11-21 MED ORDER — ALLOPURINOL 300 MG PO TABS
300.0000 mg | ORAL_TABLET | Freq: Every day | ORAL | Status: DC
  Administered 2022-11-22: 300 mg via ORAL
  Filled 2022-11-21: qty 1

## 2022-11-21 MED ORDER — CHLORHEXIDINE GLUCONATE 0.12 % MT SOLN
15.0000 mL | Freq: Once | OROMUCOSAL | Status: AC
  Administered 2022-11-21: 15 mL via OROMUCOSAL

## 2022-11-21 MED ORDER — CELECOXIB 200 MG PO CAPS: 200.0000 mg | ORAL_CAPSULE | Freq: Two times a day (BID) | ORAL | 1 refills | Status: AC

## 2022-11-21 MED ORDER — ACETAMINOPHEN 500 MG PO TABS: 1000.0000 mg | ORAL_TABLET | Freq: Four times a day (QID) | ORAL | 0 refills | Status: AC

## 2022-11-21 MED ORDER — LIDOCAINE HCL 1 % IJ SOLN
INTRAMUSCULAR | Status: DC | PRN
  Administered 2022-11-21: 10 mL

## 2022-11-21 MED ORDER — KETOROLAC TROMETHAMINE 15 MG/ML IJ SOLN
INTRAMUSCULAR | Status: DC | PRN
  Administered 2022-11-21: 15 mg via INTRAVENOUS

## 2022-11-21 MED ORDER — GENTAMICIN SULFATE 40 MG/ML IJ SOLN
5.0000 mg/kg | Freq: Once | INTRAVENOUS | Status: AC
  Administered 2022-11-21: 527.2 mg via INTRAVENOUS
  Filled 2022-11-21: qty 13.25

## 2022-11-21 MED ORDER — VANCOMYCIN HCL 1500 MG/300ML IV SOLN
1500.0000 mg | INTRAVENOUS | Status: AC
  Administered 2022-11-21: 1500 mg via INTRAVENOUS
  Filled 2022-11-21: qty 300

## 2022-11-21 MED ORDER — LIDOCAINE HCL (PF) 2 % IJ SOLN
INTRAMUSCULAR | Status: AC
  Filled 2022-11-21: qty 5

## 2022-11-21 MED ORDER — PROPOFOL 10 MG/ML IV BOLUS
INTRAVENOUS | Status: AC
  Filled 2022-11-21: qty 20

## 2022-11-21 MED ORDER — BUPIVACAINE HCL (PF) 0.5 % IJ SOLN
INTRAMUSCULAR | Status: DC | PRN
  Administered 2022-11-21: 10 mL

## 2022-11-21 MED ORDER — HYDROMORPHONE HCL 1 MG/ML IJ SOLN: 0.5000 mg | INTRAMUSCULAR | Status: DC | PRN

## 2022-11-21 MED ORDER — LIDOCAINE HCL (CARDIAC) PF 100 MG/5ML IV SOSY
PREFILLED_SYRINGE | INTRAVENOUS | Status: DC | PRN
  Administered 2022-11-21: 60 mg via INTRAVENOUS

## 2022-11-21 MED ORDER — STERILE WATER FOR IRRIGATION IR SOLN
Status: DC | PRN
  Administered 2022-11-21: 3000 mL

## 2022-11-21 MED ORDER — OXYCODONE HCL 5 MG PO TABS
ORAL_TABLET | ORAL | Status: AC
  Filled 2022-11-21: qty 1

## 2022-11-21 MED ORDER — ROCURONIUM BROMIDE 10 MG/ML (PF) SYRINGE
PREFILLED_SYRINGE | INTRAVENOUS | Status: AC
  Filled 2022-11-21: qty 10

## 2022-11-21 MED ORDER — BISACODYL 5 MG PO TBEC: 5.0000 mg | DELAYED_RELEASE_TABLET | Freq: Every day | ORAL | Status: DC | PRN

## 2022-11-21 MED ORDER — LIDOCAINE HCL 1 % IJ SOLN
INTRAMUSCULAR | Status: AC
  Filled 2022-11-21: qty 20

## 2022-11-21 MED ORDER — FLUCONAZOLE IN SODIUM CHLORIDE 200-0.9 MG/100ML-% IV SOLN
200.0000 mg | INTRAVENOUS | Status: DC
  Administered 2022-11-21: 200 mg via INTRAVENOUS
  Filled 2022-11-21: qty 100

## 2022-11-21 MED ORDER — OXYCODONE HCL 5 MG PO TABS
5.0000 mg | ORAL_TABLET | ORAL | Status: DC | PRN
  Administered 2022-11-21 – 2022-11-22 (×2): 5 mg via ORAL
  Filled 2022-11-21 (×2): qty 1

## 2022-11-21 MED ORDER — GLYCOPYRROLATE 0.2 MG/ML IJ SOLN
INTRAMUSCULAR | Status: DC | PRN
  Administered 2022-11-21: .1 mg via INTRAVENOUS

## 2022-11-21 MED ORDER — SULFAMETHOXAZOLE-TRIMETHOPRIM 800-160 MG PO TABS: 1.0000 | ORAL_TABLET | Freq: Two times a day (BID) | ORAL | 0 refills | Status: AC

## 2022-11-21 MED ORDER — DEXAMETHASONE SODIUM PHOSPHATE 10 MG/ML IJ SOLN
INTRAMUSCULAR | Status: DC | PRN
  Administered 2022-11-21: 10 mg via INTRAVENOUS

## 2022-11-21 MED ORDER — IRBESARTAN 300 MG PO TABS
300.0000 mg | ORAL_TABLET | Freq: Every day | ORAL | Status: DC
  Administered 2022-11-21 – 2022-11-22 (×2): 300 mg via ORAL
  Filled 2022-11-21 (×2): qty 1

## 2022-11-21 MED ORDER — CELECOXIB 200 MG PO CAPS
200.0000 mg | ORAL_CAPSULE | Freq: Two times a day (BID) | ORAL | Status: DC
  Administered 2022-11-21 – 2022-11-22 (×2): 200 mg via ORAL
  Filled 2022-11-21 (×2): qty 1

## 2022-11-21 MED ORDER — ONDANSETRON HCL 4 MG/2ML IJ SOLN
INTRAMUSCULAR | Status: AC
  Filled 2022-11-21: qty 2

## 2022-11-21 MED ORDER — AMLODIPINE BESYLATE 5 MG PO TABS
2.5000 mg | ORAL_TABLET | Freq: Every day | ORAL | Status: DC
  Administered 2022-11-22: 2.5 mg via ORAL
  Filled 2022-11-21: qty 1

## 2022-11-21 MED ORDER — MIDAZOLAM HCL 5 MG/5ML IJ SOLN
INTRAMUSCULAR | Status: DC | PRN
  Administered 2022-11-21: 2 mg via INTRAVENOUS

## 2022-11-21 MED ORDER — BUPIVACAINE HCL (PF) 0.5 % IJ SOLN
INTRAMUSCULAR | Status: AC
  Filled 2022-11-21: qty 30

## 2022-11-21 MED ORDER — CARVEDILOL 3.125 MG PO TABS
3.1250 mg | ORAL_TABLET | Freq: Two times a day (BID) | ORAL | Status: DC
  Administered 2022-11-21 – 2022-11-22 (×2): 3.125 mg via ORAL
  Filled 2022-11-21 (×2): qty 1

## 2022-11-21 MED ORDER — ONDANSETRON HCL 4 MG/2ML IJ SOLN
INTRAMUSCULAR | Status: DC | PRN
  Administered 2022-11-21: 4 mg via INTRAVENOUS

## 2022-11-21 MED ORDER — SUGAMMADEX SODIUM 200 MG/2ML IV SOLN
INTRAVENOUS | Status: DC | PRN
  Administered 2022-11-21: 400 mg via INTRAVENOUS

## 2022-11-21 MED ORDER — ACETAMINOPHEN 500 MG PO TABS
1000.0000 mg | ORAL_TABLET | Freq: Four times a day (QID) | ORAL | Status: DC
  Administered 2022-11-21 – 2022-11-22 (×4): 1000 mg via ORAL
  Filled 2022-11-21 (×4): qty 2

## 2022-11-21 MED ORDER — MUPIROCIN 2 % EX OINT
1.0000 | TOPICAL_OINTMENT | Freq: Once | CUTANEOUS | Status: AC
  Administered 2022-11-21: 1 via NASAL
  Filled 2022-11-21: qty 22

## 2022-11-21 MED ORDER — FENTANYL CITRATE (PF) 100 MCG/2ML IJ SOLN
INTRAMUSCULAR | Status: DC | PRN
  Administered 2022-11-21 (×2): 25 ug via INTRAVENOUS
  Administered 2022-11-21: 50 ug via INTRAVENOUS

## 2022-11-21 MED ORDER — ONDANSETRON HCL 4 MG/2ML IJ SOLN: 4.0000 mg | INTRAMUSCULAR | Status: DC | PRN

## 2022-11-21 MED ORDER — ROCURONIUM BROMIDE 100 MG/10ML IV SOLN
INTRAVENOUS | Status: DC | PRN
  Administered 2022-11-21: 50 mg via INTRAVENOUS

## 2022-11-21 MED ORDER — 0.9 % SODIUM CHLORIDE (POUR BTL) OPTIME
TOPICAL | Status: DC | PRN
  Administered 2022-11-21: 1000 mL

## 2022-11-21 MED ORDER — PROPOFOL 10 MG/ML IV BOLUS
INTRAVENOUS | Status: DC | PRN
  Administered 2022-11-21: 140 mg via INTRAVENOUS

## 2022-11-21 MED ORDER — STERILE WATER FOR IRRIGATION IR SOLN
Status: DC | PRN
  Administered 2022-11-21: 1000 mL

## 2022-11-21 MED ORDER — GLYCOPYRROLATE 0.2 MG/ML IJ SOLN
INTRAMUSCULAR | Status: AC
  Filled 2022-11-21: qty 1

## 2022-11-21 MED ORDER — ONDANSETRON HCL 4 MG/2ML IJ SOLN: 4.0000 mg | Freq: Four times a day (QID) | INTRAMUSCULAR | Status: DC | PRN

## 2022-11-21 MED ORDER — OXYCODONE HCL 5 MG/5ML PO SOLN: 5.0000 mg | Freq: Once | ORAL | Status: AC | PRN

## 2022-11-21 MED ORDER — ORAL CARE MOUTH RINSE: 15.0000 mL | Freq: Once | OROMUCOSAL | Status: AC

## 2022-11-21 MED ORDER — EPHEDRINE SULFATE (PRESSORS) 50 MG/ML IJ SOLN
INTRAMUSCULAR | Status: DC | PRN
  Administered 2022-11-21: 10 mg via INTRAVENOUS
  Administered 2022-11-21 (×2): 5 mg via INTRAVENOUS
  Administered 2022-11-21: 10 mg via INTRAVENOUS

## 2022-11-21 MED ORDER — LACTATED RINGERS IV SOLN: INTRAVENOUS | Status: DC

## 2022-11-21 MED ORDER — FENTANYL CITRATE PF 50 MCG/ML IJ SOSY: 25.0000 ug | PREFILLED_SYRINGE | INTRAMUSCULAR | Status: DC | PRN

## 2022-11-21 SURGICAL SUPPLY — 64 items
ADH SKN CLS APL DERMABOND .7 (GAUZE/BANDAGES/DRESSINGS) ×2
BAG COUNTER SPONGE SURGICOUNT (BAG) IMPLANT
BAG DECANTER FOR FLEXI CONT (MISCELLANEOUS) ×1 IMPLANT
BAG DRN RND TRDRP ANRFLXCHMBR (UROLOGICAL SUPPLIES) ×1
BAG SPNG CNTER NS LX DISP (BAG)
BAG URINE DRAIN 2000ML AR STRL (UROLOGICAL SUPPLIES) ×1 IMPLANT
BALLOON PRESSURE REGUL 61 70CM (Miscellaneous) IMPLANT
BLADE SURG 15 STRL LF DISP TIS (BLADE) ×2 IMPLANT
BLADE SURG 15 STRL SS (BLADE) ×2
BNDG GAUZE DERMACEA FLUFF 4 (GAUZE/BANDAGES/DRESSINGS) ×1 IMPLANT
BNDG GZE DERMACEA 4 6PLY (GAUZE/BANDAGES/DRESSINGS) ×1
BRIEF MESH DISP LRG (UNDERPADS AND DIAPERS) ×1 IMPLANT
CATH FOLEY 2WAY SLVR  5CC 14FR (CATHETERS) ×1
CATH FOLEY 2WAY SLVR 5CC 14FR (CATHETERS) ×1 IMPLANT
CONTROL PUMP (Urological Implant) IMPLANT
COVER MAYO STAND STRL (DRAPES) ×1 IMPLANT
CUFF URINARY OCCL 4. IZ (Miscellaneous) IMPLANT
DERMABOND ADVANCED .7 DNX12 (GAUZE/BANDAGES/DRESSINGS) ×2 IMPLANT
DISSECTOR ROUND CHERRY 3/8 STR (MISCELLANEOUS) ×1 IMPLANT
DRAIN CHANNEL 10F 3/8 F FF (DRAIN) IMPLANT
DRAPE SHEET LG 3/4 BI-LAMINATE (DRAPES) IMPLANT
DRAPE UNDERBUTTOCKS STRL (DISPOSABLE) ×1 IMPLANT
DRSG TEGADERM 4X4.75 (GAUZE/BANDAGES/DRESSINGS) ×2 IMPLANT
DRSG TELFA 3X8 NADH STRL (GAUZE/BANDAGES/DRESSINGS) ×1 IMPLANT
ELECT REM PT RETURN 15FT ADLT (MISCELLANEOUS) ×1 IMPLANT
EVACUATOR SILICONE 100CC (DRAIN) IMPLANT
GAUZE 4X4 16PLY ~~LOC~~+RFID DBL (SPONGE) ×2 IMPLANT
GLOVE BIO SURGEON STRL SZ 6.5 (GLOVE) ×1 IMPLANT
GLOVE BIO SURGEON STRL SZ7 (GLOVE) IMPLANT
GLOVE BIOGEL PI IND STRL 7.0 (GLOVE) IMPLANT
GLOVE SURG LX STRL 7.5 STRW (GLOVE) ×1 IMPLANT
GOWN STRL REUS W/ TWL XL LVL3 (GOWN DISPOSABLE) ×1 IMPLANT
GOWN STRL REUS W/TWL XL LVL3 (GOWN DISPOSABLE) ×2
HEMOSTAT ARISTA ABSORB 3G PWDR (HEMOSTASIS) IMPLANT
JET LAVAGE IRRISEPT WOUND (IRRIGATION / IRRIGATOR) ×2
KIT ACCESSORY AMS 800 IMPLANT
KIT BASIN OR (CUSTOM PROCEDURE TRAY) ×1 IMPLANT
KIT TURNOVER KIT A (KITS) IMPLANT
LAVAGE JET IRRISEPT WOUND (IRRIGATION / IRRIGATOR) IMPLANT
LOOP VESSEL MAXI BLUE (MISCELLANEOUS) ×1 IMPLANT
PACK CYSTO (CUSTOM PROCEDURE TRAY) ×1 IMPLANT
PENCIL SMOKE EVACUATOR (MISCELLANEOUS) IMPLANT
PLUG CATH AND CAP STER (CATHETERS) ×1 IMPLANT
PRESS REG BALL 61 70CM (Miscellaneous) ×1 IMPLANT
PROTECTOR NERVE ULNAR (MISCELLANEOUS) ×1 IMPLANT
RETRACTOR DEEP SCROTAL PENILE (MISCELLANEOUS) IMPLANT
SHEET LAVH (DRAPES) ×1 IMPLANT
SPIKE FLUID TRANSFER (MISCELLANEOUS) ×3 IMPLANT
STAPLER VISISTAT 35W (STAPLE) IMPLANT
SUT ETHILON 3 0 PS 1 (SUTURE) IMPLANT
SUT MNCRL AB 4-0 PS2 18 (SUTURE) ×2 IMPLANT
SUT SILK 0 FSL (SUTURE) ×1 IMPLANT
SUT VIC AB 0 CT1 27 (SUTURE) ×1
SUT VIC AB 0 CT1 27XBRD ANTBC (SUTURE) ×1 IMPLANT
SUT VIC AB 3-0 SH 27 (SUTURE) ×6
SUT VIC AB 3-0 SH 27X BRD (SUTURE) ×4 IMPLANT
SUT VIC AB 3-0 SH 27XBRD (SUTURE) IMPLANT
SYR 10ML LL (SYRINGE) ×2 IMPLANT
SYR 20ML LL LF (SYRINGE) ×1 IMPLANT
SYR 30ML LL (SYRINGE) ×1 IMPLANT
SYR BULB IRRIG 60ML STRL (SYRINGE) IMPLANT
TOWEL OR 17X26 10 PK STRL BLUE (TOWEL DISPOSABLE) ×1 IMPLANT
TOWEL OR NON WOVEN STRL DISP B (DISPOSABLE) ×1 IMPLANT
TUBING CONNECTING 10 (TUBING) ×1 IMPLANT

## 2022-11-21 NOTE — Discharge Instructions (Signed)
Artificial Urinary Sphincter Post Operative Instructions Dr. [name] Pore  You may notice some swelling or black and blue bruising. This is very common, and may increase slightly over the next several days. Typically it will begin to improve 1-2 weeks after surgery You may take a shower 48 hours after surgery. Avoid submerging yourself completely in water (bath tubs, hot tubs, swimming pools, etc) until 1 month after the surgery. To clean the incision, let soapy water gently wash over the area and pat lightly. Use supportive, tight-fitting underwear for the first two weeks after surgery. For example, jock straps, sliding shorts, or briefs Apply ice packs 20 minutes on/20 minutes off for at least the first 2 days following surgery. This will help  minimize swelling and discomfort. After the first few days you can continue using ice packs if they are helpful  The skin incisions are closed with sutures and purple skin glue. Both of these things dissolve on their own over the course of several weeks.   Avoid lifting anything heavier than 15 pounds for 1 month Do not put any direct pressure on the incision for long periods of time for the first 6 weeks following surgery. For example, do not ride a bicycle, motorcycle, ATV, or horse.  Sitting on a soft cushion, "donut" cushion, or pillow can help with the discomfort Avoid all sexual contact for 2 weeks following the surgery. You will be prescribed an antibiotic following the surgery; please take this as prescribed.  For pain post-operatively, you will typically be prescribed 2 medicines. The first is an anti-inflammatory medication (Celebrex, Toradol, Meloxicam). The second is narcotic pain medication (Tramadol, Oxycodone). You can take these as needed, and can supplement with over the counter tylenol. I recommend limiting the amount of narcotic pain medication you take as these medications can cause constipation and in rare cases, addiction.  It's important  to remember that your sphincter will not be activated until your 6 week follow up appointment. This means you will likely still leak urine immediately after the surgery. Please continue using pads or depends as needed

## 2022-11-21 NOTE — H&P (Signed)
H&P  History of Present Illness: [name] [name] is a 77 y.o. year old M who presents today for placement of AUS  No acute complaints  Past Medical History:  Diagnosis Date   A-fib ([place])    Arthritis    Dysrhythmia    A.  fib   Frequency of urination    Hematuria    History of gout    History of kidney stones    Hypertension    Pneumonia 1981   History of   Prostate cancer ([place]) 07/2020   Right ureteral stone    Urgency of urination    Wears glasses or contacts     Past Surgical History:  Procedure Laterality Date   ACHILLES TENDON SURGERY Left    BOTOX INJECTION N/A 05/23/2022   Procedure: CYSTOSCOPY BOTOX INJECTION;  Surgeon: Bjorn Loser, MD;  Location: [facility];  Service: Urology;  Laterality: N/A;   CARDIOVERSION N/A 10/14/2020   Procedure: CARDIOVERSION;  Surgeon: Elouise Munroe, MD;  Location: [facility] ENDOSCOPY;  Service: Cardiovascular;  Laterality: N/A;   CYST EXCISION     finger   CYSTO/  RETROGRADE PYELOGRAM/ RIGHT URETERAL PLACEMENT  04/04/2013   CYSTOGRAM N/A 05/23/2022   Procedure: CYSTOGRAM;  Surgeon: Bjorn Loser, MD;  Location: [facility];  Service: Urology;  Laterality: N/A;   CYSTOSCOPY WITH RETROGRADE PYELOGRAM, URETEROSCOPY AND STENT PLACEMENT Right 04/04/2013   Procedure: CYSTOSCOPY WITH RIGHT RETROGRADE PYELOGRAM, AND RIGHT STENT PLACEMENT, DIAGNOSTIC RIGHT URETEROSCOPY;  Surgeon: Alexis Frock, MD;  Location: [facility];  Service: Urology;  Laterality: Right;   CYSTOSCOPY WITH URETEROSCOPY AND STENT PLACEMENT Right 05/07/2013   Procedure: CYSTOSCOPY WITH URETEROSCOPY AND STENT PLACEMENT;  Surgeon: Alexis Frock, MD;  Location: [facility];  Service: Urology;  Laterality: Right;   HOLMIUM LASER APPLICATION Right 0000000   Procedure: HOLMIUM LASER APPLICATION;  Surgeon: Alexis Frock, MD;  Location: [facility];  Service: Urology;  Laterality: Right;   KNEE ARTHROSCOPY W/  MENISCECTOMY Right    LAPAROSCOPIC CHOLECYSTECTOMY  2003   LYMPHADENECTOMY Bilateral 07/21/2020   Procedure: LYMPHADENECTOMY;  Surgeon: Alexis Frock, MD;  Location: [facility];  Service: Urology;  Laterality: Bilateral;   ROBOT ASSISTED LAPAROSCOPIC RADICAL PROSTATECTOMY N/A 07/21/2020   Procedure: XI ROBOTIC ASSISTED LAPAROSCOPIC RADICAL PROSTATECTOMY, BILATERAL INGUINAL HERNIA REPAIR;  Surgeon: Alexis Frock, MD;  Location: [facility];  Service: Urology;  Laterality: N/A;  3 HRS   TOTAL KNEE ARTHROPLASTY  10/08/2012   Procedure: TOTAL KNEE ARTHROPLASTY;  Surgeon: Sydnee Cabal, MD;  Location: [facility];  Service: Orthopedics;  Laterality: Right;    Home Medications:  Current Meds  Medication Sig   acetaminophen (TYLENOL) 500 MG tablet Take 1,500 mg by mouth daily as needed for moderate pain or headache.   allopurinol (ZYLOPRIM) 300 MG tablet Take 300 mg by mouth daily.   amLODipine (NORVASC) 2.5 MG tablet Take 1 tablet (2.5 mg total) by mouth daily.   apixaban (ELIQUIS) 5 MG TABS tablet Take 1 tablet (5 mg total) by mouth 2 (two) times daily.   carvedilol (COREG) 3.125 MG tablet Take 1 tablet (3.125 mg total) by mouth 2 (two) times daily.   traMADol (ULTRAM) 50 MG tablet Take 50 mg by mouth daily as needed for moderate pain.   valsartan (DIOVAN) 320 MG tablet Take 1 tablet (320 mg total) by mouth daily.   [DISCONTINUED] amLODipine (NORVASC) 5 MG tablet Take 1 tablet (5 mg total) by mouth daily.   [DISCONTINUED] metoprolol succinate (TOPROL-XL)  25 MG 24 hr tablet Take 1 tablet (25 mg total) by mouth daily.    Allergies: No Known Allergies  History reviewed. No pertinent family history.  Social History:  reports that he has never smoked. He has quit using smokeless tobacco.  His smokeless tobacco use included chew. He reports that he does not currently use alcohol. He reports that he does not use drugs.  ROS: A complete review of systems was performed.  All systems are negative except for  pertinent findings as noted.  Physical Exam:  Vital signs in last 24 hours: Temp:  [98.9 F (37.2 C)] 98.9 F (37.2 C) (03/12 0630) Pulse Rate:  [56] 56 (03/12 0630) Resp:  [18] 18 (03/12 0630) BP: (146)/(82) 146/82 (03/12 0630) SpO2:  [95 %] 95 % (03/12 0630) Weight:  [133.4 kg] 133.4 kg (03/12 0536) Constitutional:  Alert and oriented, No acute distress Cardiovascular: Regular rate and rhythm Respiratory: Normal respiratory effort, Lungs clear bilaterally GI: Abdomen is soft, nontender, nondistended, no abdominal masses Lymphatic: No lymphadenopathy Neurologic: Grossly intact, no focal deficits Psychiatric: Normal mood and affect   Laboratory Data:  No results for input(s): "WBC", "HGB", "HCT", "PLT" in the last 72 hours.  No results for input(s): "NA", "K", "CL", "GLUCOSE", "BUN", "CALCIUM", "CREATININE" in the last 72 hours.  Invalid input(s): "CO3"   No results found for this or any previous visit (from the past 24 hour(s)). No results found for this or any previous visit (from the past 240 hour(s)).  Renal Function: Recent Labs    11/15/22 1041  CREATININE 1.10   Estimated Creatinine Clearance: 87.8 mL/min (by C-G formula based on SCr of 1.1 mg/dL).  Radiologic Imaging: No results found.  Assessment:  [name] [name] is a 77 y.o. year old with stress urinary incontinence here for AUS placement  Plan:  To OR as planned. Procedure and risks reviewed (bleeding, infection, hematoma, device malfunction, device infection, erosion, failure, worsening of incontinence, damage to adjacent structures)  [name] Pore, MD 11/21/2022, 7:36 AM  [facility] Pager: [phone]

## 2022-11-21 NOTE — Op Note (Addendum)
Preoperative diagnosis:  1. Stress urinary incontinence 2. History of prostate cancer s/p prostatectomy  Postoperative diagnosis: same  Procedure(s): 1. Insertion of Artificial Urinary Sphincter (AMS 800) 2. Flexible cystoscopy  Surgeon: Dr. [name] Pore  Assistant: Dr Matilde Sprang Dr Tommie Sams  Dr Macdiarmid aided in functions essential to the case, including but not limited to retraction, dissection, suction, implant preparation, and implant placement  Anesthesia: General  Complications: none  EBL: 50 cc  Intraoperative findings: cuff size 4.0, PRB in RLQ with 24 cc, good urethral coaptation with appropriately sized cuff  Indication: [name] [name] is a 77 y.o.yo M who with stress incontinence s/p prostatectomy. After an extensive discussion, he is interested in proceeding with an AUS  Description of procedure:  After appropriate consent had been obtained, the patient was brought to the operative suite where anesthesia was induced. The patient was prepped and draped in the usual sterile fashion. Extra care was taken with leg positioning to minimize the risk of compartment syndrome neuropathy and deep vein thrombosis.  Preoperative antibiotics were given. A timeout was done, then an 14 Fr foley was placed.   I an made approximately 5 cm incision in the perineum involving the lower aspect of the scrotum.  I dissected down through soft tissue and mobilized the subcutaneous tissue from the bulbospongiosus midline. The curved cellebelar retractor was used. I then split the muscle in the midline, exposing the corpus spongiosum.   It was easy to see and feel the perineal tendon that was sharply taken down with Metzenbaum scissors.  We carefully dissected the sponge free from the underlying corpus cavernosum. Ultimately it was dissected free circumferentially and a vessel loop was passed through to aid in further dissection. The dissection was continued until just over 1 cm of space was  created behind the urethra. The sized was passed and used to measure. It showed 4.0 cm, and the corresponding cuff was selected. The cuff was then passed and secured in place.  We then directed our attention to the right lower quadrant. A small incision was made, and we carefully dissected down through the layers of the abdomen. The transversalis fascia was encountered and opened. Blunt dissected was used to create a space for the PRB. It was inserted into this space, and the fascia was closed with running 3-0 vicryl. The balloon was inflated with 24 cc.   The pump was then carefully passed down into a dependent position in the scrotum  on the right. It was held in place with a babcock. The tubing was pexed with a 3-0 vicryl.   The tubing on the cuff was then passed into the RLQ incision. Excess tubing was excised, and the components were connected.   We then tested the pump and it seemed to be working properly. I then inserted the cystoscope and was able to view the cuff - there was great coaptation with the device, and it cycles appropriately. The device was then deactivated  The RLQ incision was carefully closed in layers, first using 3-0 vicryl, then 4-0 monocryl.  The perineum was then closed.  I was cognizant of the scrotal aspect of the closure with 3-0 Vicryl that also included reapproximation of the bulbospongiosus muscle.  The next layer was deeper subcutaneous suture with 3-0 Vicryl.  A third layer was close to the skin with level with 3-0 Vicryl.  4-0 running mattress sutures used for the perineum.  Dermabond was applied with fluff dressing  The Foley catheter was replaced and  draining well at the end of the case.  Leg position was good. I was very pleased with the surgery and hopefully it reaches the patient's treatment goal  [name] Pore MD 11/21/2022, 10:39 AM  [facility]  Pager: [phone]

## 2022-11-21 NOTE — Transfer of Care (Signed)
Immediate Anesthesia Transfer of Care Note  Patient: [name] [name]  Procedure(s) Performed: ARTIFICIAL URINARY SPHINCTER, CYSTOSCOPY  Patient Location: PACU  Anesthesia Type:General  Level of Consciousness: awake, alert , oriented, and patient cooperative  Airway & Oxygen Therapy: Patient Spontanous Breathing and Patient connected to face mask oxygen  Post-op Assessment: Report given to RN, Post -op Vital signs reviewed and stable, and Patient moving all extremities  Post vital signs: Reviewed and stable  Last Vitals:  Vitals Value Taken Time  BP 155/87 11/21/22 1100  Temp    Pulse 68 11/21/22 1102  Resp 21 11/21/22 1102  SpO2 94 % 11/21/22 1102  Vitals shown include unvalidated device data.  Last Pain:  Vitals:   11/21/22 0630  TempSrc: Oral         Complications: No notable events documented.

## 2022-11-21 NOTE — Anesthesia Postprocedure Evaluation (Signed)
Anesthesia Post Note  Patient: [name] [name]  Procedure(s) Performed: ARTIFICIAL URINARY SPHINCTER, CYSTOSCOPY     Patient location during evaluation: PACU Anesthesia Type: General Level of consciousness: awake and alert Pain management: pain level controlled Vital Signs Assessment: post-procedure vital signs reviewed and stable Respiratory status: spontaneous breathing, nonlabored ventilation, respiratory function stable and patient connected to nasal cannula oxygen Cardiovascular status: blood pressure returned to baseline and stable Postop Assessment: no apparent nausea or vomiting Anesthetic complications: no   No notable events documented.  Last Vitals:  Vitals:   11/21/22 1130 11/21/22 1145  BP: (!) 158/88 136/87  Pulse: 63 61  Resp: (!) 21 20  Temp:    SpO2: 96% 93%    Last Pain:  Vitals:   11/21/22 1145  TempSrc:   PainSc: 6                  Dajai Wahlert S

## 2022-11-21 NOTE — Anesthesia Procedure Notes (Signed)
Procedure Name: Intubation Date/Time: 11/21/2022 8:02 AM  Performed by: Randye Lobo, CRNAPre-anesthesia Checklist: Patient identified, Emergency Drugs available, Suction available and Patient being monitored Patient Re-evaluated:Patient Re-evaluated prior to induction Oxygen Delivery Method: Circle System Utilized Preoxygenation: Pre-oxygenation with 100% oxygen Induction Type: IV induction Ventilation: Mask ventilation without difficulty Laryngoscope Size: Mac and 4 Grade View: Grade II Tube type: Oral Tube size: 8.0 mm Number of attempts: 1 Airway Equipment and Method: Stylet and Oral airway Placement Confirmation: ETT inserted through vocal cords under direct vision, positive ETCO2 and breath sounds checked- equal and bilateral Secured at: 24 cm Tube secured with: Tape Dental Injury: Teeth and Oropharynx as per pre-operative assessment

## 2022-11-22 ENCOUNTER — Encounter (HOSPITAL_COMMUNITY): Payer: Self-pay | Admitting: Urology

## 2022-11-22 DIAGNOSIS — N393 Stress incontinence (female) (male): Secondary | ICD-10-CM | POA: Diagnosis not present

## 2022-11-22 NOTE — Progress Notes (Cosign Needed)
  Transition of Care [facility]) Screening Note   Patient Details  Name: [name] [name] Date of Birth: [birth year]   Transition of Care [facility] CM/SW Contact:    Dessa Phi, RN Phone Number: 11/22/2022, 11:11 AM    Transition of Care Department [facility]) has reviewed patient and no TOC needs have been identified at this time. We will continue to monitor patient advancement through interdisciplinary progression rounds. If new patient transition needs arise, please place a TOC consult.

## 2022-11-22 NOTE — Discharge Summary (Signed)
[facility] Discharge Summary  Admit date: 11/21/2022  Discharge date and time: 11/22/22   Discharge to: Home  Discharge Service: Urology  Discharge Attending Physician:  Dr. Terrilee Files, MD  Discharge  Diagnoses: Status post implantation of artificial urinary sphincter  Secondary Diagnosis: Principal Problem:   Status post implantation of artificial urinary sphincter   OR Procedures: Procedure(s): ARTIFICIAL URINARY SPHINCTER, CYSTOSCOPY 11/21/2022   Ancillary Procedures: None   Discharge Day Services: The patient was seen and examined by the Urology team both in the morning and immediately prior to discharge.  Vital signs and laboratory values were stable and within normal limits.  The physical exam was benign and unchanged and all surgical wounds were examined.  Discharge instructions were explained and all questions answered.  Subjective  No acute events overnight. Pain Controlled. No fever or chills.  Objective Patient Vitals for the past 8 hrs:  BP Temp Temp src Pulse Resp SpO2 Weight  11/22/22 0755 125/80 97.6 F (36.4 C) Oral 70 20 95 % --  11/22/22 0506 -- -- -- -- -- -- 134.6 kg  11/22/22 0505 120/81 97.7 F (36.5 C) Oral (!) 55 18 96 % --   Total I/O In: 364.1 [I.V.:364.1] Out: 100 [Urine:100]  General Appearance:        No acute distress Lungs:                       Normal work of breathing on room air Heart:                                Regular rate and rhythm Abdomen:                         Soft, non-tender, non-distended, Right suprapubic incision c/d/I covered with dermabond GU:           Voiding spontaneously, perineal incision c/d/i Extremities:                        Warm and well perfused   Hospital Course:  The patient underwent placement of artificial urinary sphincter on 11/21/2022.  The patient tolerated the procedure well, was extubated in the OR, and afterwards was taken to the PACU for routine post-surgical care. When stable the  patient was transferred to the floor.   The patient did well postoperatively.  The patient's diet was slowly advanced and at the time of discharge was tolerating a regular diet.  The patient was discharged home 1 Day Post-Op, at which point was tolerating a regular solid diet, was able to void spontaneously, have adequate pain control with P.O. pain medication, and could ambulate without difficulty. The patient will follow up with [place] for post op check.   Condition at Discharge: Improved  Discharge Medications:  Allergies as of 11/22/2022   No Known Allergies      Medication List     TAKE these medications    acetaminophen 500 MG tablet Commonly known as: TYLENOL Take 2 tablets (1,000 mg total) by mouth every 6 (six) hours. What changed:  how much to take when to take this reasons to take this   allopurinol 300 MG tablet Commonly known as: ZYLOPRIM Take 300 mg by mouth daily.   amLODipine 2.5 MG tablet Commonly known as: NORVASC Take 1 tablet (2.5 mg total) by mouth daily.   apixaban 5  MG Tabs tablet Commonly known as: Eliquis Take 1 tablet (5 mg total) by mouth 2 (two) times daily.   Bactrim DS 800-160 MG tablet Generic drug: sulfamethoxazole-trimethoprim Take 3-4 tablets by mouth See admin instructions. For dental procedures Take 3-4 tablets by mouth 30 mins prior to dental work What changed: Another medication with the same name was added. Make sure you understand how and when to take each.   sulfamethoxazole-trimethoprim 800-160 MG tablet Commonly known as: BACTRIM DS Take 1 tablet by mouth 2 (two) times daily. What changed: You were already taking a medication with the same name, and this prescription was added. Make sure you understand how and when to take each.   carvedilol 3.125 MG tablet Commonly known as: COREG Take 1 tablet (3.125 mg total) by mouth 2 (two) times daily.   celecoxib 200 MG capsule Commonly known as: CELEBREX Take 1 capsule (200 mg total) by  mouth 2 (two) times daily.   fluticasone 50 MCG/ACT nasal spray Commonly known as: FLONASE Place 1 spray into both nostrils daily as needed (sinus/allergies.).   oxyCODONE 5 MG immediate release tablet Commonly known as: Oxy IR/ROXICODONE Take 1 tablet (5 mg total) by mouth every 4 (four) hours as needed for severe pain.   traMADol 50 MG tablet Commonly known as: ULTRAM Take 50 mg by mouth daily as needed for moderate pain.   valsartan 320 MG tablet Commonly known as: DIOVAN Take 1 tablet (320 mg total) by mouth daily.

## 2022-11-22 NOTE — Progress Notes (Signed)
Foley cath removed as ordered. Tolerated well.

## 2022-11-22 NOTE — Progress Notes (Signed)
Called patient regarding the Elequis.   Urology advised he hold it until Friday.    Patient and wife verbalize understanding.

## 2022-11-22 NOTE — Progress Notes (Signed)
Per urology patient should meet post indwelling urinary catheter protocol for safe discharge.  Bladder scan at 10 am, if less than 150 ok to discharge.     Will monitor closely.

## 2022-11-22 NOTE — Progress Notes (Signed)
AVS instructions were reviewed with patient and wife at bedside.   All personal belongings were returned, all questions were answered.    Patient and wife verbalize understanding.

## 2022-11-28 ENCOUNTER — Telehealth: Payer: Self-pay | Admitting: Pharmacist

## 2022-11-28 NOTE — Telephone Encounter (Signed)
Called pt to f/u with BP, HR, and LE edema since decreasing his amlodipine from 5mg  to 2.5mg  daily and changing his Toprol to carvedilol.  Reports BP has been really good, 118-120/79-80. Reports "a tad" of swelling but has improved, is watching his salt intake too. HR has been mid 50s-mid 60s. Will continue current meds.

## 2022-12-25 ENCOUNTER — Other Ambulatory Visit: Payer: Self-pay

## 2022-12-25 DIAGNOSIS — I712 Thoracic aortic aneurysm, without rupture, unspecified: Secondary | ICD-10-CM

## 2022-12-25 NOTE — Progress Notes (Signed)
Scheduled patient's CT test for next year.

## 2023-02-09 ENCOUNTER — Other Ambulatory Visit: Payer: Self-pay | Admitting: Cardiovascular Disease

## 2023-03-04 ENCOUNTER — Other Ambulatory Visit: Payer: Self-pay | Admitting: Cardiovascular Disease

## 2023-04-13 ENCOUNTER — Other Ambulatory Visit: Payer: Self-pay | Admitting: Cardiovascular Disease

## 2023-04-13 NOTE — Telephone Encounter (Signed)
Prescription refill request for Eliquis received. Indication:afib Last office visit:1/24 Scr:1.10  3/24 Age: 77 Weight:134.6  kg  Prescription refilled

## 2023-05-09 ENCOUNTER — Other Ambulatory Visit: Payer: Self-pay | Admitting: Cardiovascular Disease

## 2023-06-03 ENCOUNTER — Other Ambulatory Visit: Payer: Self-pay | Admitting: Cardiovascular Disease

## 2023-07-08 ENCOUNTER — Other Ambulatory Visit: Payer: Self-pay | Admitting: Cardiovascular Disease

## 2023-08-11 ENCOUNTER — Other Ambulatory Visit: Payer: Self-pay | Admitting: Cardiovascular Disease

## 2023-09-24 ENCOUNTER — Encounter (HOSPITAL_BASED_OUTPATIENT_CLINIC_OR_DEPARTMENT_OTHER): Payer: Self-pay

## 2023-09-24 ENCOUNTER — Other Ambulatory Visit: Payer: Self-pay

## 2023-09-24 DIAGNOSIS — I712 Thoracic aortic aneurysm, without rupture, unspecified: Secondary | ICD-10-CM

## 2023-09-24 DIAGNOSIS — I10 Essential (primary) hypertension: Secondary | ICD-10-CM

## 2023-09-24 DIAGNOSIS — I4819 Other persistent atrial fibrillation: Secondary | ICD-10-CM

## 2023-09-24 NOTE — Progress Notes (Signed)
Needs BMET for CT 

## 2023-10-09 LAB — BASIC METABOLIC PANEL
BUN/Creatinine Ratio: 15 (ref 10–24)
BUN: 15 mg/dL (ref 8–27)
CO2: 25 mmol/L (ref 20–29)
Calcium: 9.8 mg/dL (ref 8.6–10.2)
Chloride: 103 mmol/L (ref 96–106)
Creatinine, Ser: 1.01 mg/dL (ref 0.76–1.27)
Glucose: 91 mg/dL (ref 70–99)
Potassium: 4.7 mmol/L (ref 3.5–5.2)
Sodium: 144 mmol/L (ref 134–144)
eGFR: 78 mL/min/{1.73_m2} (ref >59)

## 2023-10-11 ENCOUNTER — Ambulatory Visit (HOSPITAL_COMMUNITY): Admission: RE | Admit: 2023-10-11 | Payer: Medicare PPO | Source: Ambulatory Visit

## 2023-10-16 ENCOUNTER — Ambulatory Visit (HOSPITAL_COMMUNITY)
Admission: RE | Admit: 2023-10-16 | Discharge: 2023-10-16 | Disposition: A | Discharge status: Home / Self Care | Payer: Medicare PPO | Source: Ambulatory Visit | Attending: Cardiovascular Disease | Admitting: Cardiovascular Disease

## 2023-10-16 DIAGNOSIS — I712 Thoracic aortic aneurysm, without rupture, unspecified: Secondary | ICD-10-CM | POA: Diagnosis present

## 2023-10-16 MED ORDER — IOHEXOL 350 MG/ML SOLN
75.0000 mL | Freq: Once | INTRAVENOUS | Status: AC | PRN
  Administered 2023-10-16: 75 mL via INTRAVENOUS
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
## 2023-10-17 NOTE — Telephone Encounter (Addendum)
 Left message for patient to call back. Sent [employer].  ----- Message from Janelle Mediate sent at 10/17/2023  2:45 PM EST ----- Does he have recent labs/lipids with primary if not needs lipid/liver panel

## 2023-10-22 DIAGNOSIS — E7800 Pure hypercholesterolemia, unspecified: Secondary | ICD-10-CM

## 2023-10-26 ENCOUNTER — Ambulatory Visit: Payer: Medicare PPO | Admitting: Cardiovascular Disease

## 2023-10-26 ENCOUNTER — Other Ambulatory Visit (HOSPITAL_COMMUNITY): Payer: Self-pay

## 2023-10-26 ENCOUNTER — Encounter: Payer: Self-pay | Admitting: Cardiovascular Disease

## 2023-10-26 ENCOUNTER — Ambulatory Visit: Payer: Medicare PPO | Attending: Cardiovascular Disease | Admitting: Cardiovascular Disease

## 2023-10-26 VITALS — BP 134/80 | HR 60 | Ht 76.0 in | Wt 302.0 lb

## 2023-10-26 DIAGNOSIS — Z5181 Encounter for therapeutic drug level monitoring: Secondary | ICD-10-CM | POA: Diagnosis not present

## 2023-10-26 DIAGNOSIS — I10 Essential (primary) hypertension: Secondary | ICD-10-CM

## 2023-10-26 DIAGNOSIS — I4819 Other persistent atrial fibrillation: Secondary | ICD-10-CM

## 2023-10-26 DIAGNOSIS — Z7901 Long term (current) use of anticoagulants: Secondary | ICD-10-CM | POA: Diagnosis not present

## 2023-10-26 MED ORDER — ATORVASTATIN CALCIUM 10 MG PO TABS
5.0000 mg | ORAL_TABLET | Freq: Every day | ORAL | 3 refills | Status: DC
  Filled 2023-10-26: qty 45, 90d supply, fill #0

## 2023-10-26 NOTE — Addendum Note (Signed)
Addended by: Virl Axe, Malayiah Mcbrayer L on: 10/26/2023 09:58 AM   Modules accepted: Orders

## 2023-10-26 NOTE — Patient Instructions (Addendum)
Medication Instructions:  Your physician has recommended you make the following change in your medication:   1-DECREASE Coreg 3.125 mg by mouth  daily.  *If you need a refill on your cardiac medications before your next appointment, please call your pharmacy*  Lab Work: If you have labs (blood work) drawn today and your tests are completely normal, you will receive your results only by: MyChart Message (if you have MyChart) OR A paper copy in the mail If you have any lab test that is abnormal or we need to change your treatment, we will call you to review the results.  Testing/Procedures: Your physician has requested that you have an echocardiogram. Echocardiography is a painless test that uses sound waves to create images of your heart. It provides your doctor with information about the size and shape of your heart and how well your heart's chambers and valves are working. This procedure takes approximately one hour. There are no restrictions for this procedure. Please do NOT wear cologne, perfume, aftershave, or lotions (deodorant is allowed). Please arrive 15 minutes prior to your appointment time.  Please note: We ask at that you not bring children with you during ultrasound (echo/ vascular) testing. Due to room size and safety concerns, children are not allowed in the ultrasound rooms during exams. Our front office staff cannot provide observation of children in our lobby area while testing is being conducted. An adult accompanying a patient to their appointment will only be allowed in the ultrasound room at the discretion of the ultrasound technician under special circumstances. We apologize for any inconvenience. Follow-Up: At [facility], you and your health needs are our priority.  As part of our continuing mission to provide you with exceptional heart care, we have created designated Provider Care Teams.  These Care Teams include your primary Cardiologist (physician) and  Advanced Practice Providers (APPs -  Physician Assistants and Nurse Practitioners) who all work together to provide you with the care you need, when you need it.  We recommend signing up for the patient portal called "MyChart".  Sign up information is provided on this After Visit Summary.  MyChart is used to connect with patients for Virtual Visits (Telemedicine).  Patients are able to view lab/test results, encounter notes, upcoming appointments, etc.  Non-urgent messages can be sent to your provider as well.   To learn more about what you can do with MyChart, go to [email or web address].    Your next appointment:   1 year(s)  Provider:   Charlton Haws, MD     Other Instructions    1st Floor: - Lobby - Registration  - Pharmacy  - Lab - Cafe  2nd Floor: - PV Lab - Diagnostic Testing (echo, CT, nuclear med)  3rd Floor: - Vacant  4th Floor: - TCTS (cardiothoracic surgery) - AFib Clinic - Structural Heart Clinic - Vascular Surgery  - Vascular Ultrasound  5th Floor: - [facility] Card[facility] (general and EP) - Clinical Pharmacy for coumadin, hypertension, lipid, weight-loss medications, and med management appointments    Valet parking services will be available as well.

## 2023-10-30 ENCOUNTER — Other Ambulatory Visit (HOSPITAL_COMMUNITY): Payer: Self-pay

## 2023-10-30 MED ORDER — ATORVASTATIN CALCIUM 10 MG PO TABS: 5.0000 mg | ORAL_TABLET | Freq: Every day | ORAL | 3 refills | Status: DC

## 2023-10-30 NOTE — Addendum Note (Signed)
Addended by: Virl Axe, Lizbet Cirrincione L on: 10/30/2023 12:01 PM   Modules accepted: Orders

## 2023-11-01 ENCOUNTER — Other Ambulatory Visit (HOSPITAL_COMMUNITY): Payer: Self-pay

## 2023-11-05 MED ORDER — ATORVASTATIN CALCIUM 10 MG PO TABS: 5.0000 mg | ORAL_TABLET | Freq: Every day | ORAL | 3 refills | Status: AC

## 2023-11-05 NOTE — Addendum Note (Signed)
 Addended by: Virl Axe, Shalev Helminiak L on: 11/05/2023 12:21 PM   Modules accepted: Orders

## 2023-11-14 ENCOUNTER — Ambulatory Visit (HOSPITAL_COMMUNITY): Payer: Medicare PPO | Attending: Cardiology

## 2023-11-14 DIAGNOSIS — I10 Essential (primary) hypertension: Secondary | ICD-10-CM | POA: Insufficient documentation

## 2023-11-14 DIAGNOSIS — Z5181 Encounter for therapeutic drug level monitoring: Secondary | ICD-10-CM | POA: Diagnosis present

## 2023-11-14 DIAGNOSIS — I4819 Other persistent atrial fibrillation: Secondary | ICD-10-CM | POA: Diagnosis present

## 2023-11-14 DIAGNOSIS — Z7901 Long term (current) use of anticoagulants: Secondary | ICD-10-CM | POA: Insufficient documentation

## 2023-11-14 LAB — ECHOCARDIOGRAM COMPLETE: S' Lateral: 3.6 cm

## 2023-11-19 ENCOUNTER — Telehealth: Payer: Self-pay | Admitting: Cardiovascular Disease

## 2023-11-19 NOTE — Telephone Encounter (Signed)
 Patient returned RN's call regarding results.

## 2023-11-20 NOTE — Telephone Encounter (Signed)
 [name] Stade, MD to Me    11/14/23  4:22 PM Result Note EF normal just mild AR overall good   Left message for patient to call back.

## 2023-11-22 NOTE — Telephone Encounter (Signed)
 The patient has been notified of the result and verbalized understanding.  All questions (if any) were answered. Frutoso Schatz, RN 11/22/2023 11:18 AM

## 2023-11-22 NOTE — Telephone Encounter (Signed)
 Pt was returning nurse call and is requesting a callback. Please advise.

## 2024-05-20 ENCOUNTER — Telehealth: Payer: Self-pay | Admitting: Cardiovascular Disease

## 2024-05-20 MED ORDER — APIXABAN 5 MG PO TABS: 5.0000 mg | ORAL_TABLET | Freq: Two times a day (BID) | ORAL | 1 refills | Status: AC

## 2024-05-20 NOTE — Telephone Encounter (Signed)
 *  STAT* If patient is at the pharmacy, call can be transferred to refill team.   1. Which medications need to be refilled? (please list name of each medication and dose if known)   ELIQUIS  5 MG TABS tablet   2. Which pharmacy/location (including street and city if local pharmacy) is medication to be sent to?  [id number] - [place], [place] - [address]    3. Do they need a 30 day or 90 day supply? 90 days   Patient will be completely out on Thurs 05/22/24

## 2024-05-20 NOTE — Telephone Encounter (Signed)
 Prescription refill request for Eliquis  received. Indication: AF Last office visit: 10/26/23  SHAUNNA Emmer MD Scr: 1.19 on 10/25/23  Epic Age: 78 Weight: 137kg  Based on above findings Eliquis  5mg  twice daily is the appropriate dose.  Refill approved.

## 2024-07-09 ENCOUNTER — Other Ambulatory Visit: Payer: Self-pay | Admitting: Cardiovascular Disease
